# Patient Record
Sex: Female | Born: 1940
Health system: Southern US, Community
[De-identification: ages and names within clinical notes are randomized; demographics above are authoritative.]

## PROBLEM LIST (undated history)

## (undated) DIAGNOSIS — E785 Hyperlipidemia, unspecified: Secondary | ICD-10-CM

## (undated) DIAGNOSIS — E079 Disorder of thyroid, unspecified: Secondary | ICD-10-CM

## (undated) DIAGNOSIS — M810 Age-related osteoporosis without current pathological fracture: Secondary | ICD-10-CM

## (undated) DIAGNOSIS — I10 Essential (primary) hypertension: Secondary | ICD-10-CM

## (undated) DIAGNOSIS — M1990 Unspecified osteoarthritis, unspecified site: Secondary | ICD-10-CM

## (undated) DIAGNOSIS — E039 Hypothyroidism, unspecified: Secondary | ICD-10-CM

## (undated) HISTORY — DX: Hyperlipidemia, unspecified: E78.5

## (undated) HISTORY — DX: Essential (primary) hypertension: I10

## (undated) HISTORY — PX: APPENDECTOMY: SHX54

## (undated) HISTORY — PX: TUBAL LIGATION: SHX77

---
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
## 1999-02-05 ENCOUNTER — Other Ambulatory Visit: Admission: RE | Admit: 1999-02-05 | Discharge: 1999-02-05 | Payer: Self-pay

## 2000-02-16 ENCOUNTER — Other Ambulatory Visit: Admission: RE | Admit: 2000-02-16 | Discharge: 2000-02-16 | Payer: Self-pay | Admitting: Family Medicine

## 2003-03-20 ENCOUNTER — Encounter: Payer: Self-pay | Admitting: Family Medicine

## 2003-03-20 ENCOUNTER — Ambulatory Visit (HOSPITAL_COMMUNITY): Admission: RE | Admit: 2003-03-20 | Discharge: 2003-03-20 | Payer: Self-pay | Admitting: Family Medicine

## 2004-04-02 ENCOUNTER — Emergency Department (HOSPITAL_COMMUNITY): Admission: EM | Admit: 2004-04-02 | Discharge: 2004-04-02 | Payer: Self-pay | Admitting: Emergency Medicine

## 2004-10-19 ENCOUNTER — Ambulatory Visit: Payer: Self-pay | Admitting: Family Medicine

## 2005-01-20 ENCOUNTER — Ambulatory Visit: Payer: Self-pay | Admitting: Family Medicine

## 2005-03-16 ENCOUNTER — Ambulatory Visit: Payer: Self-pay | Admitting: Family Medicine

## 2005-07-19 ENCOUNTER — Ambulatory Visit: Payer: Self-pay | Admitting: Family Medicine

## 2005-11-18 ENCOUNTER — Ambulatory Visit: Payer: Self-pay | Admitting: Family Medicine

## 2006-01-13 ENCOUNTER — Ambulatory Visit: Payer: Self-pay | Admitting: Family Medicine

## 2006-05-16 ENCOUNTER — Ambulatory Visit: Payer: Self-pay | Admitting: Family Medicine

## 2006-09-20 ENCOUNTER — Ambulatory Visit: Payer: Self-pay | Admitting: Family Medicine

## 2006-09-29 ENCOUNTER — Ambulatory Visit: Payer: Self-pay | Admitting: Family Medicine

## 2007-01-27 ENCOUNTER — Ambulatory Visit: Payer: Self-pay | Admitting: Family Medicine

## 2008-12-29 ENCOUNTER — Emergency Department (HOSPITAL_COMMUNITY): Admission: EM | Admit: 2008-12-29 | Discharge: 2008-12-29 | Payer: Self-pay | Admitting: Emergency Medicine

## 2008-12-31 ENCOUNTER — Ambulatory Visit: Payer: Self-pay | Admitting: Cardiology

## 2009-01-20 ENCOUNTER — Encounter: Payer: Self-pay | Admitting: Cardiology

## 2009-01-20 ENCOUNTER — Ambulatory Visit: Payer: Self-pay | Admitting: Cardiology

## 2009-02-07 ENCOUNTER — Ambulatory Visit (HOSPITAL_COMMUNITY): Admission: RE | Admit: 2009-02-07 | Discharge: 2009-02-07 | Payer: Self-pay | Admitting: Orthopaedic Surgery

## 2010-05-06 HISTORY — PX: ROTATOR CUFF REPAIR: SHX139

## 2010-05-11 ENCOUNTER — Inpatient Hospital Stay (HOSPITAL_COMMUNITY): Admission: RE | Admit: 2010-05-11 | Discharge: 2010-05-13 | Payer: Self-pay | Admitting: Orthopedic Surgery

## 2011-02-22 LAB — CBC
HCT: 31.6 % — ABNORMAL LOW (ref 36.0–46.0)
Hemoglobin: 10.9 g/dL — ABNORMAL LOW (ref 12.0–15.0)
Hemoglobin: 11.9 g/dL — ABNORMAL LOW (ref 12.0–15.0)
MCHC: 34 g/dL (ref 30.0–36.0)
MCHC: 34.6 g/dL (ref 30.0–36.0)
MCHC: 35.3 g/dL (ref 30.0–36.0)
RBC: 3.32 MIL/uL — ABNORMAL LOW (ref 3.87–5.11)
RBC: 3.58 MIL/uL — ABNORMAL LOW (ref 3.87–5.11)
RBC: 4.56 MIL/uL (ref 3.87–5.11)
WBC: 6.6 10*3/uL (ref 4.0–10.5)
WBC: 6.7 10*3/uL (ref 4.0–10.5)

## 2011-02-22 LAB — BASIC METABOLIC PANEL
CO2: 27 mEq/L (ref 19–32)
CO2: 29 mEq/L (ref 19–32)
CO2: 30 mEq/L (ref 19–32)
Calcium: 8.4 mg/dL (ref 8.4–10.5)
Calcium: 8.5 mg/dL (ref 8.4–10.5)
Calcium: 9.5 mg/dL (ref 8.4–10.5)
Creatinine, Ser: 0.82 mg/dL (ref 0.4–1.2)
Creatinine, Ser: 0.83 mg/dL (ref 0.4–1.2)
GFR calc Af Amer: >60 mL/min (ref >60)
GFR calc Af Amer: >60 mL/min (ref >60)
GFR calc Af Amer: >60 mL/min (ref >60)
GFR calc non Af Amer: >60 mL/min (ref >60)
Glucose, Bld: 99 mg/dL (ref 70–99)
Potassium: 3.2 mEq/L — ABNORMAL LOW (ref 3.5–5.1)
Sodium: 136 mEq/L (ref 135–145)
Sodium: 139 mEq/L (ref 135–145)

## 2011-02-22 LAB — URINALYSIS, ROUTINE W REFLEX MICROSCOPIC
Bilirubin Urine: NEGATIVE
Ketones, ur: NEGATIVE mg/dL
Nitrite: NEGATIVE
Protein, ur: NEGATIVE mg/dL
Urobilinogen, UA: 1 mg/dL (ref 0.0–1.0)
pH: 5.5 (ref 5.0–8.0)

## 2011-02-22 LAB — DIFFERENTIAL
Basophils Absolute: 0 10*3/uL (ref 0.0–0.1)
Basophils Relative: 0 % (ref 0–1)
Lymphocytes Relative: 38 % (ref 12–46)
Monocytes Absolute: 0.6 10*3/uL (ref 0.1–1.0)
Neutro Abs: 3.5 10*3/uL (ref 1.7–7.7)
Neutrophils Relative %: 52 % (ref 43–77)

## 2011-02-22 LAB — TYPE AND SCREEN
ABO/RH(D): A POS
Antibody Screen: NEGATIVE

## 2011-02-22 LAB — ABO/RH: ABO/RH(D): A POS

## 2011-02-22 LAB — APTT: aPTT: 30 seconds (ref 24–37)

## 2011-02-22 LAB — PROTIME-INR: INR: 1.04 (ref 0.00–1.49)

## 2011-02-22 LAB — URINE MICROSCOPIC-ADD ON

## 2011-03-18 LAB — BASIC METABOLIC PANEL
BUN: 13 mg/dL (ref 6–23)
CO2: 26 mEq/L (ref 19–32)
Glucose, Bld: 111 mg/dL — ABNORMAL HIGH (ref 70–99)
Potassium: 3.8 mEq/L (ref 3.5–5.1)
Sodium: 139 mEq/L (ref 135–145)

## 2011-03-18 LAB — CBC
HCT: 42.6 % (ref 36.0–46.0)
Hemoglobin: 14.9 g/dL (ref 12.0–15.0)
MCHC: 34.9 g/dL (ref 30.0–36.0)
Platelets: 365 10*3/uL (ref 150–400)
RDW: 12.6 % (ref 11.5–15.5)

## 2011-03-22 LAB — COMPREHENSIVE METABOLIC PANEL
ALT: 34 U/L (ref 0–35)
AST: 37 U/L (ref 0–37)
Albumin: 4.3 g/dL (ref 3.5–5.2)
Calcium: 9.8 mg/dL (ref 8.4–10.5)
GFR calc Af Amer: >60 mL/min (ref >60)
Potassium: 4 mEq/L (ref 3.5–5.1)
Sodium: 139 mEq/L (ref 135–145)
Total Protein: 7.4 g/dL (ref 6.0–8.3)

## 2011-03-22 LAB — CBC
MCHC: 33.3 g/dL (ref 30.0–36.0)
Platelets: 379 10*3/uL (ref 150–400)
RDW: 13 % (ref 11.5–15.5)

## 2011-03-22 LAB — DIFFERENTIAL
Eosinophils Absolute: 0 10*3/uL (ref 0.0–0.7)
Eosinophils Relative: 1 % (ref 0–5)
Lymphs Abs: 2.7 10*3/uL (ref 0.7–4.0)
Monocytes Absolute: 0.8 10*3/uL (ref 0.1–1.0)
Monocytes Relative: 10 % (ref 3–12)
Neutrophils Relative %: 54 % (ref 43–77)

## 2011-03-22 LAB — POCT CARDIAC MARKERS: Troponin i, poc: <0.05 ng/mL (ref 0.00–0.09)

## 2011-04-20 NOTE — Assessment & Plan Note (Signed)
[facility]                            CARDIOLOGY OFFICE NOTE   NAME:Chelsea Finley, Chelsea Finley                      MRN:          [id number]  DATE:12/31/2008                            DOB:          09-Jul-1941    REFERRING PHYSICIAN:  Donnetta Hutching, MD   PRIMARY CARE PHYSICIAN:  Delaney Meigs, MD   REASON FOR PRESENTATION:  Evaluate the patient with chest pain.   HISTORY OF PRESENT ILLNESS:  The patient is a lovely 70 year old white  female without prior cardiac history.  She was in the emergency room at  [facility] on Sunday with chest discomfort.  There was no objective  evidence of ischemia.  Cardiac enzymes were normal.  An EKG was  unremarkable.  It was thought to be somewhat atypical and she was  referred here for followup.   The patient reports that she was at church.  She developed some pressure  in her lower chest, upper epigastric area.  It seemed to radiate  slightly upward.  It was 5/10 in intensity.  She did feel like her head  got tight, and she felt somewhat lightheaded and presyncopal though she  did not lose consciousness.  She was encouraged by family to present to  [facility].  By the time she got there, the discomfort had abated.  It  lasted for a total of 25 to 30 minutes.  It went away on its own.  There  was no associated nausea, vomiting, or diaphoresis.  There was no  associated palpitations, presyncope, or syncope.  She had no significant  shortness of breath.   The patient is somewhat limited in her activities by knee pain.  However, she does vacuuming and other chores of daily living and does  not get any cardiovascular symptoms.   PAST MEDICAL HISTORY:  1. Hypertension x4 years.  2. Arthritis.  3. Hyperlipidemia, recently diagnosed.   PAST SURGICAL HISTORY:  Appendectomy and tubal ligation.   ALLERGIES:  None.   MEDICATIONS:  Tramadol and hydrochlorothiazide 25 mg daily.   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  She is active in church.  She has 4 children and 9 grandchildren.  She does not smoke cigarettes.  She does not drink alcohol.   FAMILY HISTORY:  Noncontributory for early coronary artery disease.  Her  father died of lung problems.  Mother died of natural causes.   REVIEW OF SYSTEMS:  As stated in the HPI and positive for shoulder pains  and joint pains.  Negative for all other systems.   PHYSICAL EXAMINATION:  GENERAL:  The patient is in no distress.  VITAL SIGNS:  Blood pressure 162/93, heart rate 82 and regular, weight  206 pounds, and body mass index 36.  HEENT:  Eyelids unremarkable; pupils equal, round, and reactive to  light; fundi within normal limits, oral mucosa unremarkable.  NECK:  No jugular venous distention at 45 degrees; carotid upstroke  brisk and symmetric; no bruits, no thyromegaly.  LYMPHATICS:  No cervical, axillary, or inguinal adenopathy.  LUNGS:  Clear to auscultation bilaterally.  BACK:  No costovertebral angle tenderness.  CHEST:  Unremarkable.  HEART:  PMI not displaced or sustained; S1 and S2 within normal limits;  no S3, no S4; no clicks, no rubs, no murmurs.  ABDOMEN:  Obese; positive  bowel sounds; normal in frequency and pitch; no bruits, no rebound, no  guarding; no midline pulsatile mass; no hepatomegaly or splenomegaly.  SKIN:  No rashes.  No nodules.  EXTREMITIES:  Pulses 2+ throughout; no edema, no cyanosis, no clubbing.  NEURO:  Oriented to person, place, and time.  Cranial nerves II through  XII grossly intact, motor grossly intact throughout.   EKG:  Sinus rhythm, rate 82, axis within normal limits, intervals within  normal limits, poor anterior R-wave progression, and no acute ST-T wave  changes.   ASSESSMENT AND PLAN:  1. Chest pain.  The patient's chest pain is atypical.  There has been      no objective evidence of ischemia.  I think the pretest probability      of obstructive coronary disease is somewhat low.  I think  screening      her with an exercise treadmill test would be reasonable.  This will      allow me to rule out obstructive coronary disease, risk stratify,      and most importantly try to give her a prescription for exercise.      Despite her knee pain, she thinks she would be able to ambulate on      a treadmill and we will try this.  2. Obesity.  We discussed weight loss, diet, and exercise, and she is      committed to trying Weight Watchers.  3. Hypertension.  Her blood pressure is elevated here, which she      states her nurse, who is her daughter, takes it at home and it is      never elevated.  She will keep an eye on this.  It should come down      even better with weight loss.  4. Dyslipidemia.  She was recently found to have slightly elevated      lipids.  Dr. Lysbeth Galas started her on Vytorin, but she is out of those      samples.  She is going to go back to him to get a      prescription for this.  5. Followup.  I will see her at the time of her treadmill.     Rollene Rotunda, MD, [facility]  Electronically Signed    JH/MedQ  DD: 12/31/2008  DT: 01/01/2009  Job #: [id number]   cc:   Delaney Meigs, M.D.

## 2011-04-20 NOTE — Op Note (Signed)
Chelsea Finley, Chelsea Finley               ACCOUNT NO.:  [id number]   MEDICAL RECORD NO.:  [id number]          PATIENT TYPE:  AMB   LOCATION:  SDS                          FACILITY:  [facility]   PHYSICIAN:  Vanita Panda. Magnus Ivan, M.D.DATE OF BIRTH:  [birth year]   DATE OF PROCEDURE:  02/07/2009  DATE OF DISCHARGE:  02/07/2009                               OPERATIVE REPORT   PREOPERATIVE DIAGNOSIS:  Right shoulder severe glenohumeral joint  arthritis likely partial rotator cuff tearing and impingement.   POSTOPERATIVE DIAGNOSES:  1. Severe right shoulder glenohumeral arthritis.  2. Partial tearing of the rotator cuff.  3. Impingement.  4. Bursitis of right shoulder.   PROCEDURE:  1. Right shoulder arthroscopy with extensive debridement.  2. Right shoulder arthroscopic subacromial decompression.   SURGEON:  Vanita Panda. Magnus Ivan, MD   ANESTHESIA:  1. Regional right shoulder block.  2. General.   BLOOD LOSS:  Minimal.   COMPLICATIONS:  None.   INDICATIONS:  Briefly, Chelsea Finley is a right-hand-dominant 70 year old  with severe arthritis involving both her shoulders.  This began to  develop a debilitating type of pain for her associated with overhead  activity, and x-rays confirmed severe arthritis in that shoulder, but  the ball was not high riding.  I talked to her about the possibility of  a partial shoulder replacement versus arthroscopic surgery, and she  wanted to try to have as minimal surgery as possible.  I told her that  the arthroscopic surgery could be a temporizing fix for her and that she  should give it a try and she should do well with an injection as well.   PROCEDURE DESCRIPTION:  After informed consent was obtained, appropriate  right shoulder was marked, anesthesia obtained, regional shoulder block.  She was then brought to the operating room, placed supine on the  operating table.  General anesthesia was obtained.  She was then  fashioned in a beach chair  position with appropriate padding and  positioning of the head and neck and the nonoperative left arm.  A right  arm was prepped and draped with DuraPrep and sterile drapes and placed  in in-line skeletal traction with 10 pounds of traction and 45 degrees  of forward flexion and neutral rotation.  There was also appropriate  bending at the waist and knees, but she had pulses in her feet.  A time-  out was called, and she was identified as the correct patient and  correct right extremity and correct right shoulder.  I then made a  posterior portal off the posterolateral edge of the acromion and the  glenohumeral joint was encountered.  There was significant synovitis  throughout, the joint and there was significant cartilage irregularities  of the humeral head and the glenoid.  I then made an anterior portal  just lateral to the coracoid process.  An arthroscopic soft tissue  ablation wand and a shaver was placed.  An extensive  debridement  was  carried out at the labrum, the undersurface the rotator cuff, and on the  ball-and-socket themselves.  Next, I entered the subacromial space  through  the posterior portal and a separate lateral portal was made off  the lateral edge of the acromion approximately 2 cm.  Arthroscope was  inserted.  Again in the subacromial space, a soft tissue ablation wand  was placed laterally and there was significant bursitis and bone spurs  in the subacromial area.  After a thorough decompression using soft  tissue ablation wand and a high-speed bur to coplane underneath the  clavicle and the acromion to remove bone spurs, there was more than  adequate space to the rotator cuff.  I did clean the significant  tendinosis and tendonitis off the rotator cuff and found that the  majority of the rotator cuff was attached to the bone and it was scarred  down significantly as well.  There was probably full-thickness tearing  of the most anterior leading  edge of the  rotator cuff, but it all moved  as a unit and I felt that I should not proceed with any type of repair  due to the fact that  it felt like debridement was going to be adequate  for her.  I then removed all instrumentation and closed the portal sites  with interrupted 3-0 nylon suture.  Xeroform followed up with well-  padded sterile dressing was applied, and the patient arm was placed in a  standard arm sling.  She was awakened, extubated, and taken to recovery  room in stable condition.  All final counts were correct, and there were  no complications noted.       Vanita Panda. Magnus Ivan, M.D.  Electronically Signed     CYB/MEDQ  D:  02/07/2009  T:  02/08/2009  Job:  [id number]

## 2011-11-03 ENCOUNTER — Encounter (HOSPITAL_COMMUNITY): Payer: Self-pay

## 2011-11-05 ENCOUNTER — Other Ambulatory Visit (HOSPITAL_COMMUNITY): Payer: Self-pay | Admitting: Orthopaedic Surgery

## 2011-11-10 ENCOUNTER — Encounter (HOSPITAL_COMMUNITY)
Admission: RE | Admit: 2011-11-10 | Discharge: 2011-11-10 | Disposition: A | Discharge status: Home / Self Care | Payer: Medicare Other | Source: Ambulatory Visit | Attending: Orthopaedic Surgery | Admitting: Orthopaedic Surgery

## 2011-11-10 ENCOUNTER — Encounter (HOSPITAL_COMMUNITY): Payer: Self-pay

## 2011-11-10 ENCOUNTER — Other Ambulatory Visit: Payer: Self-pay

## 2011-11-10 ENCOUNTER — Ambulatory Visit (HOSPITAL_COMMUNITY)
Admission: RE | Admit: 2011-11-10 | Discharge: 2011-11-10 | Disposition: A | Discharge status: Home / Self Care | Payer: Medicare Other | Source: Ambulatory Visit | Attending: Orthopaedic Surgery | Admitting: Orthopaedic Surgery

## 2011-11-10 DIAGNOSIS — Z01810 Encounter for preprocedural cardiovascular examination: Secondary | ICD-10-CM | POA: Insufficient documentation

## 2011-11-10 DIAGNOSIS — Z01818 Encounter for other preprocedural examination: Secondary | ICD-10-CM | POA: Insufficient documentation

## 2011-11-10 DIAGNOSIS — Z01812 Encounter for preprocedural laboratory examination: Secondary | ICD-10-CM | POA: Insufficient documentation

## 2011-11-10 DIAGNOSIS — Z96619 Presence of unspecified artificial shoulder joint: Secondary | ICD-10-CM | POA: Insufficient documentation

## 2011-11-10 LAB — PROTIME-INR
INR: 1 (ref 0.00–1.49)
Prothrombin Time: 13.4 seconds (ref 11.6–15.2)

## 2011-11-10 LAB — APTT: aPTT: 33 seconds (ref 24–37)

## 2011-11-10 LAB — BASIC METABOLIC PANEL
CO2: 25 mEq/L (ref 19–32)
Calcium: 9.4 mg/dL (ref 8.4–10.5)
Creatinine, Ser: 0.74 mg/dL (ref 0.50–1.10)
GFR calc non Af Amer: 84 mL/min — ABNORMAL LOW (ref >90)

## 2011-11-10 LAB — URINALYSIS, ROUTINE W REFLEX MICROSCOPIC
Bilirubin Urine: NEGATIVE
Ketones, ur: 15 mg/dL — AB
Nitrite: NEGATIVE
Specific Gravity, Urine: 1.006 (ref 1.005–1.030)
Urobilinogen, UA: 0.2 mg/dL (ref 0.0–1.0)

## 2011-11-10 LAB — CBC
MCH: 31.4 pg (ref 26.0–34.0)
MCV: 92.7 fL (ref 78.0–100.0)
Platelets: 353 10*3/uL (ref 150–400)
RDW: 12.9 % (ref 11.5–15.5)
WBC: 6.9 10*3/uL (ref 4.0–10.5)

## 2011-11-10 LAB — URINE MICROSCOPIC-ADD ON

## 2011-11-10 NOTE — Pre-Procedure Instructions (Signed)
20 Chelsea Finley  11/10/2011   Your procedure is scheduled on:  Dec11  Report to [facility] at 0930 AM.  Call this number if you have problems the morning of surgery: [phone]   Remember:   Do not eat food:After Midnight.  May have clear liquids: up to 4 Hours before arrival.  Clear liquids include soda, tea, black coffee, apple or grape juice, broth.  Take these medicines the morning of surgery with A SIP OF WATER: tramadol   Do not wear jewelry, make-up or nail polish.  Do not wear lotions, powders, or perfumes. You may wear deodorant.  Do not shave 48 hours prior to surgery.  Do not bring valuables to the hospital.  Contacts, dentures or bridgework may not be worn into surgery.  Leave suitcase in the car. After surgery it may be brought to your room.  For patients admitted to the hospital, checkout time is 11:00 AM the day of discharge.   Patients discharged the day of surgery will not be allowed to drive home.  Name and phone number of your driver: NA  Special Instructions: CHG Shower Use Special Wash: 1/2 bottle night before surgery and 1/2 bottle morning of surgery.   Please read over the following fact sheets that you were given: Pain Booklet, Blood Transfusion Information, Total Joint Packet and Surgical Site Infection Prevention

## 2011-11-15 MED ORDER — CEFAZOLIN SODIUM-DEXTROSE 2-3 GM-% IV SOLR
2.0000 g | INTRAVENOUS | Status: AC
  Administered 2011-11-16: 2 g via INTRAVENOUS
  Filled 2011-11-15: qty 50

## 2011-11-16 ENCOUNTER — Inpatient Hospital Stay (HOSPITAL_COMMUNITY): Payer: Medicare Other

## 2011-11-16 ENCOUNTER — Encounter (HOSPITAL_COMMUNITY): Payer: Self-pay | Admitting: Anesthesiology

## 2011-11-16 ENCOUNTER — Inpatient Hospital Stay (HOSPITAL_COMMUNITY): Payer: Medicare Other | Admitting: Anesthesiology

## 2011-11-16 ENCOUNTER — Encounter (HOSPITAL_COMMUNITY)
Admission: RE | Disposition: A | Discharge status: Home Health | Payer: Self-pay | Source: Ambulatory Visit | Attending: Orthopaedic Surgery

## 2011-11-16 ENCOUNTER — Inpatient Hospital Stay (HOSPITAL_COMMUNITY)
Admission: RE | Admit: 2011-11-16 | Discharge: 2011-11-18 | DRG: 470 | Disposition: A | Discharge status: Home Health | Payer: Medicare Other | Source: Ambulatory Visit | Attending: Orthopaedic Surgery | Admitting: Orthopaedic Surgery

## 2011-11-16 ENCOUNTER — Encounter (HOSPITAL_COMMUNITY): Payer: Self-pay | Admitting: Surgery

## 2011-11-16 DIAGNOSIS — M1711 Unilateral primary osteoarthritis, right knee: Secondary | ICD-10-CM

## 2011-11-16 DIAGNOSIS — Z886 Allergy status to analgesic agent status: Secondary | ICD-10-CM

## 2011-11-16 DIAGNOSIS — Z79899 Other long term (current) drug therapy: Secondary | ICD-10-CM

## 2011-11-16 DIAGNOSIS — M1710 Unilateral primary osteoarthritis, unspecified knee: Principal | ICD-10-CM | POA: Diagnosis present

## 2011-11-16 HISTORY — PX: KNEE ARTHROPLASTY: SHX992

## 2011-11-16 IMAGING — CR DG KNEE 1-2V PORT*R*
2 series · 2 of 2 positions shown · non-contrast
Comparison: None.

CLINICAL DATA: Right knee arthroplasty.  Postop.

PORTABLE RIGHT KNEE - 1-2 VIEW

[view not recorded (1 of 2)]
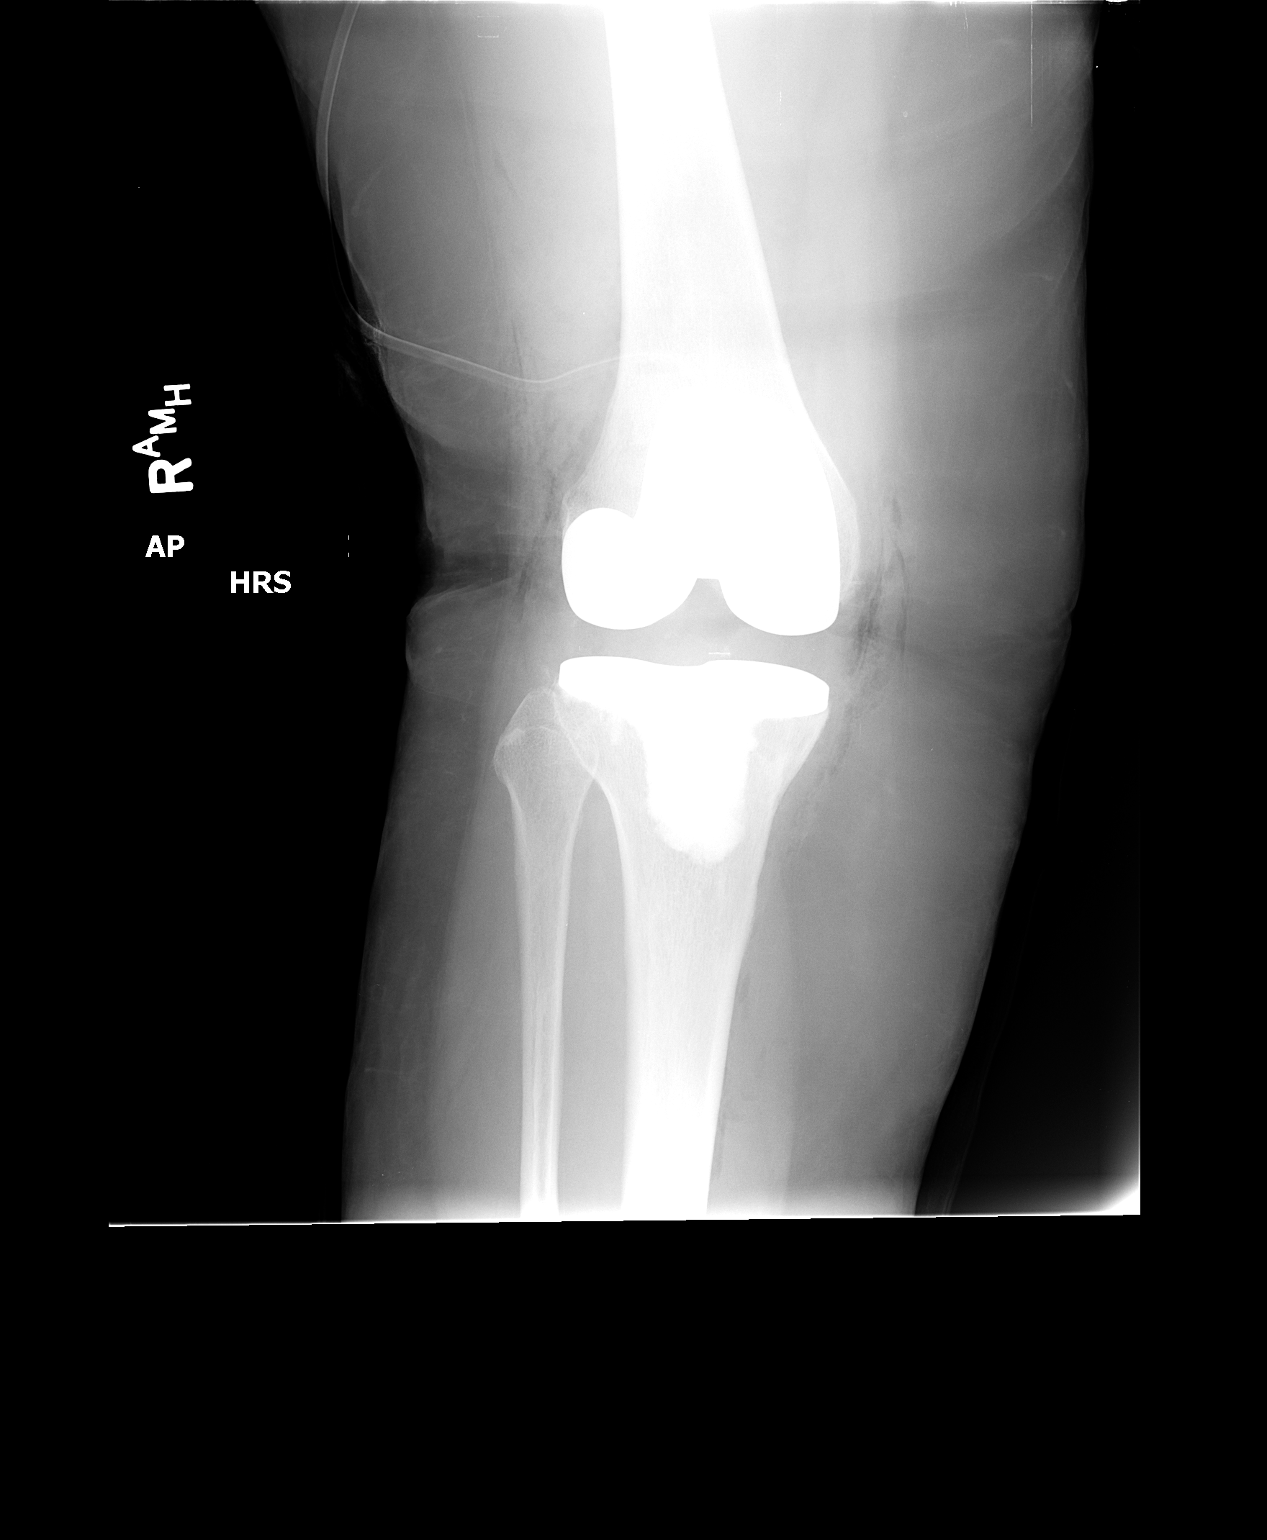

[view not recorded (2 of 2)]
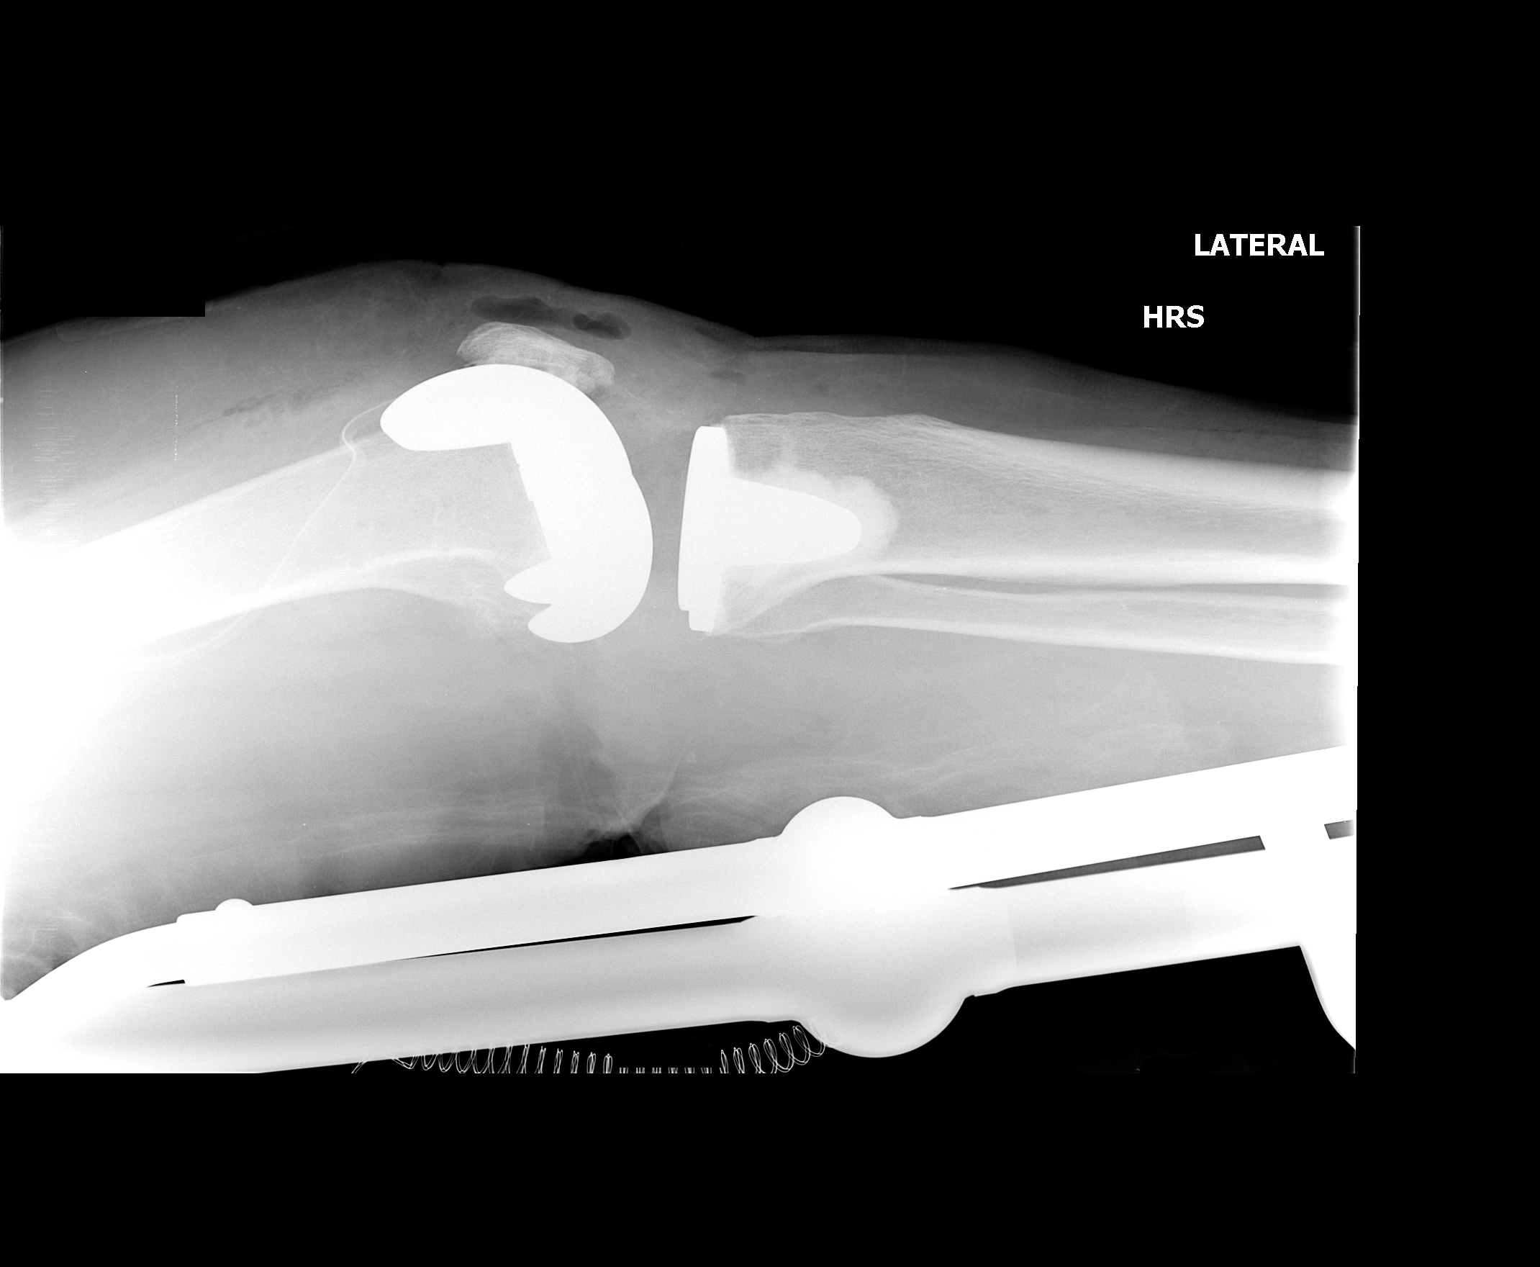

[2 of 2 positions shown; findings below may reference images not displayed]

FINDINGS: Status post right knee arthroplasty.  No acute hardware
complication identified.  Expected postoperative swelling and gas
about the knee.  Surgical drain in place.
IMPRESSION: Expected appearance after right knee arthroplasty.

## 2011-11-16 SURGERY — ARTHROPLASTY, KNEE, TOTAL, USING IMAGELESS COMPUTER-ASSISTED NAVIGATION
Anesthesia: General | Site: Knee | Laterality: Right | Wound class: Clean

## 2011-11-16 MED ORDER — HYDROMORPHONE HCL PF 1 MG/ML IJ SOLN
0.2500 mg | INTRAMUSCULAR | Status: DC | PRN
Start: 2011-11-16 — End: 2011-11-16
  Administered 2011-11-16 (×4): 0.5 mg via INTRAVENOUS

## 2011-11-16 MED ORDER — ALUM & MAG HYDROXIDE-SIMETH 200-200-20 MG/5ML PO SUSP: 30.0000 mL | ORAL | Status: DC | PRN

## 2011-11-16 MED ORDER — LACTATED RINGERS IV SOLN
INTRAVENOUS | Status: DC | PRN
  Administered 2011-11-16 (×2): via INTRAVENOUS

## 2011-11-16 MED ORDER — METHOCARBAMOL 500 MG PO TABS
500.0000 mg | ORAL_TABLET | Freq: Four times a day (QID) | ORAL | Status: DC | PRN
  Administered 2011-11-17 – 2011-11-18 (×4): 500 mg via ORAL
  Filled 2011-11-16 (×5): qty 1

## 2011-11-16 MED ORDER — PROMETHAZINE HCL 25 MG/ML IJ SOLN: 6.2500 mg | INTRAMUSCULAR | Status: DC | PRN

## 2011-11-16 MED ORDER — OXYCODONE-ACETAMINOPHEN 5-325 MG PO TABS: 1.0000 | ORAL_TABLET | ORAL | Status: DC | PRN

## 2011-11-16 MED ORDER — CEFAZOLIN SODIUM 1-5 GM-% IV SOLN
1.0000 g | Freq: Four times a day (QID) | INTRAVENOUS | Status: AC
  Administered 2011-11-16 – 2011-11-17 (×3): 1 g via INTRAVENOUS
  Filled 2011-11-16 (×3): qty 50

## 2011-11-16 MED ORDER — ONDANSETRON HCL 4 MG/2ML IJ SOLN: 4.0000 mg | Freq: Four times a day (QID) | INTRAMUSCULAR | Status: DC | PRN

## 2011-11-16 MED ORDER — KETOROLAC TROMETHAMINE 30 MG/ML IJ SOLN
30.0000 mg | Freq: Once | INTRAMUSCULAR | Status: AC
  Administered 2011-11-16: 30 mg via INTRAVENOUS

## 2011-11-16 MED ORDER — PHENOL 1.4 % MT LIQD
1.0000 | OROMUCOSAL | Status: DC | PRN
  Filled 2011-11-16: qty 177

## 2011-11-16 MED ORDER — BUPIVACAINE-EPINEPHRINE PF 0.5-1:200000 % IJ SOLN
INTRAMUSCULAR | Status: DC | PRN
  Administered 2011-11-16: 30 mL

## 2011-11-16 MED ORDER — FERROUS SULFATE 325 (65 FE) MG PO TABS
325.0000 mg | ORAL_TABLET | Freq: Three times a day (TID) | ORAL | Status: DC
  Administered 2011-11-17 – 2011-11-18 (×5): 325 mg via ORAL
  Filled 2011-11-16 (×7): qty 1

## 2011-11-16 MED ORDER — DEXTROSE 5 % IV SOLN: 500.0000 mg | Freq: Four times a day (QID) | INTRAVENOUS | Status: DC | PRN

## 2011-11-16 MED ORDER — METOCLOPRAMIDE HCL 5 MG/ML IJ SOLN
5.0000 mg | Freq: Three times a day (TID) | INTRAMUSCULAR | Status: DC | PRN
  Filled 2011-11-16: qty 2

## 2011-11-16 MED ORDER — ONDANSETRON HCL 4 MG PO TABS: 4.0000 mg | ORAL_TABLET | Freq: Four times a day (QID) | ORAL | Status: DC | PRN

## 2011-11-16 MED ORDER — HYDROMORPHONE HCL PF 1 MG/ML IJ SOLN
INTRAMUSCULAR | Status: AC
  Filled 2011-11-16: qty 1

## 2011-11-16 MED ORDER — HYDROMORPHONE HCL PF 1 MG/ML IJ SOLN: 0.5000 mg | INTRAMUSCULAR | Status: DC | PRN

## 2011-11-16 MED ORDER — FENTANYL CITRATE 0.05 MG/ML IJ SOLN
INTRAMUSCULAR | Status: AC
  Filled 2011-11-16: qty 2

## 2011-11-16 MED ORDER — MIDAZOLAM HCL 5 MG/5ML IJ SOLN
INTRAMUSCULAR | Status: DC | PRN
  Administered 2011-11-16 (×2): 1 mg via INTRAVENOUS

## 2011-11-16 MED ORDER — KETOROLAC TROMETHAMINE 30 MG/ML IJ SOLN
INTRAMUSCULAR | Status: AC
  Filled 2011-11-16: qty 1

## 2011-11-16 MED ORDER — DIPHENHYDRAMINE HCL 12.5 MG/5ML PO ELIX
12.5000 mg | ORAL_SOLUTION | ORAL | Status: DC | PRN
  Filled 2011-11-16: qty 10

## 2011-11-16 MED ORDER — MENTHOL 3 MG MT LOZG: 1.0000 | LOZENGE | OROMUCOSAL | Status: DC | PRN

## 2011-11-16 MED ORDER — METOCLOPRAMIDE HCL 10 MG PO TABS: 5.0000 mg | ORAL_TABLET | Freq: Three times a day (TID) | ORAL | Status: DC | PRN

## 2011-11-16 MED ORDER — ACETAMINOPHEN 650 MG RE SUPP: 650.0000 mg | Freq: Four times a day (QID) | RECTAL | Status: DC | PRN

## 2011-11-16 MED ORDER — SODIUM CHLORIDE 0.9 % IR SOLN
Status: DC | PRN
  Administered 2011-11-16: 3000 mL

## 2011-11-16 MED ORDER — MIDAZOLAM HCL 2 MG/2ML IJ SOLN
INTRAMUSCULAR | Status: AC
  Filled 2011-11-16: qty 2

## 2011-11-16 MED ORDER — PROPOFOL 10 MG/ML IV EMUL
INTRAVENOUS | Status: DC | PRN
  Administered 2011-11-16: 150 mg via INTRAVENOUS

## 2011-11-16 MED ORDER — SODIUM CHLORIDE 0.9 % IV SOLN
INTRAVENOUS | Status: DC
  Administered 2011-11-16: 1000 mL via INTRAVENOUS

## 2011-11-16 MED ORDER — KETOROLAC TROMETHAMINE 30 MG/ML IJ SOLN
INTRAMUSCULAR | Status: DC | PRN
  Administered 2011-11-16: 30 mg via INTRAVENOUS

## 2011-11-16 MED ORDER — FENTANYL CITRATE 0.05 MG/ML IJ SOLN
INTRAMUSCULAR | Status: DC | PRN
  Administered 2011-11-16 (×3): 50 ug via INTRAVENOUS
  Administered 2011-11-16 (×2): 100 ug via INTRAVENOUS

## 2011-11-16 MED ORDER — ONDANSETRON HCL 4 MG/2ML IJ SOLN
INTRAMUSCULAR | Status: DC | PRN
  Administered 2011-11-16: 4 mg via INTRAVENOUS

## 2011-11-16 MED ORDER — ZOLPIDEM TARTRATE 5 MG PO TABS: 5.0000 mg | ORAL_TABLET | Freq: Every evening | ORAL | Status: DC | PRN

## 2011-11-16 MED ORDER — RIVAROXABAN 10 MG PO TABS
10.0000 mg | ORAL_TABLET | Freq: Every day | ORAL | Status: DC
  Administered 2011-11-17 – 2011-11-18 (×2): 10 mg via ORAL
  Filled 2011-11-16 (×4): qty 1

## 2011-11-16 MED ORDER — ACETAMINOPHEN 325 MG PO TABS: 650.0000 mg | ORAL_TABLET | Freq: Four times a day (QID) | ORAL | Status: DC | PRN

## 2011-11-16 MED ORDER — HYDROCODONE-ACETAMINOPHEN 5-325 MG PO TABS
1.0000 | ORAL_TABLET | ORAL | Status: DC | PRN
  Administered 2011-11-17 (×2): 1 via ORAL
  Administered 2011-11-17 (×3): 2 via ORAL
  Administered 2011-11-17 (×2): 1 via ORAL
  Administered 2011-11-18 (×3): 2 via ORAL
  Filled 2011-11-16: qty 2
  Filled 2011-11-16 (×2): qty 1
  Filled 2011-11-16 (×2): qty 2
  Filled 2011-11-16: qty 1
  Filled 2011-11-16 (×4): qty 2

## 2011-11-16 SURGICAL SUPPLY — 63 items
BANDAGE ELASTIC 6 VELCRO ST LF (GAUZE/BANDAGES/DRESSINGS) ×4 IMPLANT
BANDAGE ESMARK 6X9 LF (GAUZE/BANDAGES/DRESSINGS) ×1 IMPLANT
BLADE SAGITTAL 25.0X1.19X90 (BLADE) ×4 IMPLANT
BLADE SAW SGTL 13.0X1.19X90.0M (BLADE) ×2 IMPLANT
BNDG CMPR 9X6 STRL LF SNTH (GAUZE/BANDAGES/DRESSINGS) ×1
BNDG ESMARK 6X9 LF (GAUZE/BANDAGES/DRESSINGS) ×2
BOWL SMART MIX CTS (DISPOSABLE) ×1 IMPLANT
CEMENT HV SMART SET (Cement) ×4 IMPLANT
CLOTH BEACON ORANGE TIMEOUT ST (SAFETY) ×2 IMPLANT
COVER BACK TABLE 24X17X13 BIG (DRAPES) IMPLANT
COVER SURGICAL LIGHT HANDLE (MISCELLANEOUS) ×2 IMPLANT
CUFF TOURNIQUET SINGLE 34IN LL (TOURNIQUET CUFF) ×1 IMPLANT
CUFF TOURNIQUET SINGLE 44IN (TOURNIQUET CUFF) IMPLANT
DRAPE INCISE IOBAN 66X45 STRL (DRAPES) ×2 IMPLANT
DRAPE ORTHO SPLIT 77X108 STRL (DRAPES) ×4
DRAPE PROXIMA HALF (DRAPES) ×2 IMPLANT
DRAPE SURG ORHT 6 SPLT 77X108 (DRAPES) ×2 IMPLANT
DRAPE U-SHAPE 47X51 STRL (DRAPES) ×2 IMPLANT
DRSG PAD ABDOMINAL 8X10 ST (GAUZE/BANDAGES/DRESSINGS) ×2 IMPLANT
DURAPREP 26ML APPLICATOR (WOUND CARE) ×2 IMPLANT
ELECT REM PT RETURN 9FT ADLT (ELECTROSURGICAL) ×2
ELECTRODE REM PT RTRN 9FT ADLT (ELECTROSURGICAL) ×1 IMPLANT
EVACUATOR 1/8 PVC DRAIN (DRAIN) ×2 IMPLANT
FACESHIELD LNG OPTICON STERILE (SAFETY) ×6 IMPLANT
GAUZE XEROFORM 5X9 LF (GAUZE/BANDAGES/DRESSINGS) ×2 IMPLANT
GLOVE BIO SURGEON STRL SZ7.5 (GLOVE) ×2 IMPLANT
GLOVE BIOGEL PI IND STRL 8 (GLOVE) ×1 IMPLANT
GLOVE BIOGEL PI INDICATOR 8 (GLOVE) ×1
GLOVE ECLIPSE 7.0 STRL STRAW (GLOVE) ×2 IMPLANT
GLOVE ORTHO TXT STRL SZ7.5 (GLOVE) ×2 IMPLANT
GOWN PREVENTION PLUS LG XLONG (DISPOSABLE) IMPLANT
GOWN PREVENTION PLUS XLARGE (GOWN DISPOSABLE) ×3 IMPLANT
GOWN STRL NON-REIN LRG LVL3 (GOWN DISPOSABLE) ×4 IMPLANT
HANDPIECE INTERPULSE COAX TIP (DISPOSABLE) ×2
IMMOBILIZER KNEE 22 UNIV (SOFTGOODS) IMPLANT
KIT BASIN OR (CUSTOM PROCEDURE TRAY) ×2 IMPLANT
KIT ROOM TURNOVER OR (KITS) ×2 IMPLANT
MANIFOLD NEPTUNE II (INSTRUMENTS) ×2 IMPLANT
MARKER SPHERE PSV REFLC THRD 5 (MARKER) ×6 IMPLANT
NDL SPNL 18GX3.5 QUINCKE PK (NEEDLE) ×1 IMPLANT
NEEDLE SPNL 18GX3.5 QUINCKE PK (NEEDLE) ×2 IMPLANT
NS IRRIG 1000ML POUR BTL (IV SOLUTION) ×2 IMPLANT
PACK TOTAL JOINT (CUSTOM PROCEDURE TRAY) ×2 IMPLANT
PAD ARMBOARD 7.5X6 YLW CONV (MISCELLANEOUS) ×4 IMPLANT
PADDING CAST COTTON 6X4 STRL (CAST SUPPLIES) ×2 IMPLANT
PIN SCHANZ 4MM 130MM (PIN) IMPLANT
SET HNDPC FAN SPRY TIP SCT (DISPOSABLE) IMPLANT
SPONGE GAUZE 4X4 12PLY (GAUZE/BANDAGES/DRESSINGS) ×2 IMPLANT
STAPLER VISISTAT 35W (STAPLE) ×2 IMPLANT
STRIP CLOSURE SKIN 1/2X4 (GAUZE/BANDAGES/DRESSINGS) ×4 IMPLANT
SUCTION FRAZIER TIP 10 FR DISP (SUCTIONS) ×1 IMPLANT
SUT ETHILON 3 0 PS 1 (SUTURE) ×2 IMPLANT
SUT VIC AB 1 CT1 27 (SUTURE) ×4
SUT VIC AB 1 CT1 27XBRD ANBCTR (SUTURE) ×2 IMPLANT
SUT VIC AB 2-0 CT1 27 (SUTURE) ×4
SUT VIC AB 2-0 CT1 TAPERPNT 27 (SUTURE) ×2 IMPLANT
SUT VIC AB 4-0 PS2 27 (SUTURE) ×2 IMPLANT
SWABSTICK BENZOIN STERILE (MISCELLANEOUS) ×2 IMPLANT
SYR 50ML LL SCALE MARK (SYRINGE) ×2 IMPLANT
TOWEL OR 17X24 6PK STRL BLUE (TOWEL DISPOSABLE) ×2 IMPLANT
TOWEL OR 17X26 10 PK STRL BLUE (TOWEL DISPOSABLE) ×2 IMPLANT
TRAY FOLEY CATH 14FR (SET/KITS/TRAYS/PACK) ×1 IMPLANT
WATER STERILE IRR 1000ML POUR (IV SOLUTION) ×2 IMPLANT

## 2011-11-16 NOTE — Anesthesia Procedure Notes (Addendum)
Anesthesia Regional Block:  Femoral nerve block  Pre-Anesthetic Checklist: ,, timeout performed, Correct Patient, Correct Site, Correct Laterality, Correct Procedure, Correct Position, site marked, Risks and benefits discussed,  Surgical consent,  Pre-op evaluation,  At surgeon's request and post-op pain management  Laterality: Right  Prep: chloraprep       Needles:  Injection technique: Single-shot  Needle Type: Stimulator Needle - 40      Needle Gauge: 22 and 22 G    Additional Needles:  Procedures: nerve stimulator Femoral nerve block  Nerve Stimulator or Paresthesia:  Response: patella twitch, 0.45 mA,   Additional Responses:   Narrative:  Start time: 11/16/2011 11:29 AM End time: 11/16/2011 11:33 AM  Performed by: With CRNAs  Anesthesiologist: Sandford Craze, MD  Additional Notes: Pt identified in Holding room.  Monitors applied. Working IV access confirmed. Sterile prep.  #22ga PNS to patella twitch at 0.46mA threshold.  30cc 0.5% Bupivacaine with 1:200k epi injected incrementally after negative test dose.  Patient asymptomatic, VSS, no heme aspirated, tolerated well.      Procedure Name: LMA Insertion Date/Time: 11/16/2011 11:50 AM Performed by: Fuller Canada Pre-anesthesia Checklist: Timeout performed, Patient identified, Emergency Drugs available, Suction available and Patient being monitored Patient Re-evaluated:Patient Re-evaluated prior to inductionOxygen Delivery Method: Circle System Utilized Preoxygenation: Pre-oxygenation with 100% oxygen Intubation Type: IV induction LMA: LMA inserted LMA Size: 4.0 Number of attempts: 1 Placement Confirmation: positive ETCO2 and breath sounds checked- equal and bilateral Tube secured with: Tape Dental Injury: Teeth and Oropharynx as per pre-operative assessment

## 2011-11-16 NOTE — Preoperative (Signed)
Beta Blockers   Reason not to administer Beta Blockers:Not Applicable 

## 2011-11-16 NOTE — Anesthesia Postprocedure Evaluation (Signed)
  Anesthesia Post-op Note  Patient: Chelsea Finley  Procedure(s) Performed:  COMPUTER ASSISTED TOTAL KNEE ARTHROPLASTY - Right total knee arthroplasty  Patient Location: PACU  Anesthesia Type: GA combined with regional for post-op pain  Level of Consciousness: awake, alert  and oriented  Airway and Oxygen Therapy: Patient Spontanous Breathing and Patient connected to nasal cannula oxygen  Post-op Pain: none  Post-op Assessment: Post-op Vital signs reviewed, Patient's Cardiovascular Status Stable, Respiratory Function Stable, Patent Airway, No signs of Nausea or vomiting and Pain level controlled  Post-op Vital Signs: Reviewed and stable  Complications: No apparent anesthesia complications

## 2011-11-16 NOTE — Transfer of Care (Signed)
Immediate Anesthesia Transfer of Care Note  Patient: Chelsea Finley  Procedure(s) Performed:  COMPUTER ASSISTED TOTAL KNEE ARTHROPLASTY - Right total knee arthroplasty  Patient Location: PACU  Anesthesia Type: General  Level of Consciousness: awake, alert  and oriented  Airway & Oxygen Therapy: Patient Spontanous Breathing and Patient connected to nasal cannula oxygen  Post-op Assessment: Report given to PACU RN and Post -op Vital signs reviewed and stable  Post vital signs: Reviewed and stable  Complications: No apparent anesthesia complications

## 2011-11-16 NOTE — Brief Op Note (Signed)
11/16/2011  2:04 PM  PATIENT:  Chelsea Finley  70 y.o. female  PRE-OPERATIVE DIAGNOSIS:  Severe osteoarthritis right knee  POST-OPERATIVE DIAGNOSIS:  severe osteoarthritis right knee  PROCEDURE:  Procedure(s): COMPUTER ASSISTED TOTAL KNEE ARTHROPLASTY  SURGEON:  Surgeon(s): Kathryne Hitch  PHYSICIAN ASSISTANT:   ASSISTANTS: Maud Deed, PA-C   ANESTHESIA:   regional and general  EBL:  Total I/O In: 1000 [I.V.:1000] Out: 150 [Urine:150]  BLOOD ADMINISTERED:none  DRAINS: (Medium) Hemovact drain(s) in the knee with  Suction Open   LOCAL MEDICATIONS USED:  NONE  SPECIMEN:  No Specimen  DISPOSITION OF SPECIMEN:  N/A  COUNTS:  YES  TOURNIQUET:   Total Tourniquet Time Documented: Thigh (Right) - 87 minutes  DICTATION: .Other Dictation: Dictation Number [id number]  PLAN OF CARE: Admit to inpatient   PATIENT DISPOSITION:  PACU - hemodynamically stable.   Delay start of Pharmacological VTE agent (>24hrs) due to surgical blood loss or risk of bleeding:  {YES/NO/NOT APPLICABLE:20182

## 2011-11-16 NOTE — OR Nursing (Signed)
cpm applied, xray obtained

## 2011-11-16 NOTE — Anesthesia Preprocedure Evaluation (Signed)
Anesthesia Evaluation  Patient identified by MRN, date of birth, ID band Patient awake    Reviewed: Allergy & Precautions, H&P , NPO status , Patient's Chart, lab work & pertinent test results  Airway Mallampati: II TM Distance: >3 FB Neck ROM: Full    Dental  (+) Teeth Intact, Partial Upper, Partial Lower and Dental Advisory Given   Pulmonary neg pulmonary ROS,  clear to auscultation  Pulmonary exam normal       Cardiovascular hypertension (borderline, on no meds presently), Regular Normal    Neuro/Psych Negative Neurological ROS     GI/Hepatic Neg liver ROS, GERD-  Controlled,  Endo/Other  Morbid obesity  Renal/GU negative Renal ROS     Musculoskeletal  (+) Arthritis -, Osteoarthritis,    Abdominal (+) obese,   Peds  Hematology   Anesthesia Other Findings   Reproductive/Obstetrics                           Anesthesia Physical Anesthesia Plan  ASA: II  Anesthesia Plan: General   Post-op Pain Management:    Induction: Intravenous  Airway Management Planned: LMA  Additional Equipment:   Intra-op Plan:   Post-operative Plan:   Informed Consent: I have reviewed the patients History and Physical, chart, labs and discussed the procedure including the risks, benefits and alternatives for the proposed anesthesia with the patient or authorized representative who has indicated his/her understanding and acceptance.   Dental advisory given  Plan Discussed with: CRNA and Surgeon  Anesthesia Plan Comments: (Plan routine monitors, GA- LMA OK, femoral nerve block for post op analgesia)        Anesthesia Quick Evaluation

## 2011-11-16 NOTE — H&P (Signed)
Chelsea Finley is an 70 y.o. female.   Chief Complaint: Severe right knee pain, know end-stage arthritis HPI: 70 yo female with bone-on-bone wear of her right knee.  Wishes to proceed with a right total knee replacement.  Has failed multiple injections, rest, nsaids and ambulating with a cane.  Understands fully the risks of acute blood loss, nerve injury, DVT, PE.  Goals are increased quality of life and mobility and decreased pain.  History reviewed. No pertinent past medical history.  Past Surgical History  Procedure Date  . Rotator cuff repair 05/2010    History reviewed. No pertinent family history. Social History:  reports that she has never smoked. She does not have any smokeless tobacco history on file. She reports that she does not drink alcohol or use illicit drugs.  Allergies:  Allergies  Allergen Reactions  . Morphine And Related Nausea And Vomiting    Medications Prior to Admission  Medication Dose Route Frequency Provider Last Rate Last Dose  . ceFAZolin (ANCEF) IVPB 2 g/50 mL premix  2 g Intravenous 60 min Pre-Op Kathryne Hitch       No current outpatient prescriptions on file as of 11/16/2011.    No results found for this or any previous visit (from the past 48 hour(s)). No results found.  Review of Systems  Constitutional: Negative.   HENT: Negative.   Eyes: Negative.   Respiratory: Negative.   Cardiovascular: Negative.   Gastrointestinal: Negative.   Genitourinary: Negative.   Musculoskeletal: Positive for joint pain.  Skin: Negative.   Neurological: Negative.   Endo/Heme/Allergies: Negative.     Blood pressure 165/86, pulse 74, temperature 98.1 F (36.7 C), temperature source Oral, resp. rate 20, SpO2 94.00%. Physical Exam  Constitutional: She is oriented to person, place, and time. She appears well-developed and well-nourished.  HENT:  Head: Normocephalic and atraumatic.  Eyes: EOM are normal. Pupils are equal, round, and reactive to  light.  Neck: Normal range of motion. Neck supple.  Cardiovascular: Normal rate and regular rhythm.   Respiratory: Effort normal and breath sounds normal.  GI: Soft. Bowel sounds are normal.  Musculoskeletal:       Right knee: She exhibits decreased range of motion, effusion and abnormal alignment. tenderness found. Medial joint line and lateral joint line tenderness noted.  Neurological: She is alert and oriented to person, place, and time.  Skin: Skin is warm and dry.  Psychiatric: She has a normal mood and affect.     Assessment/Plan To the OR today for a right total knee replacement.  [name] Y 11/16/2011, 11:23 AM

## 2011-11-17 LAB — BASIC METABOLIC PANEL
BUN: 11 mg/dL (ref 6–23)
Chloride: 105 mEq/L (ref 96–112)
GFR calc Af Amer: >90 mL/min (ref >90)
Potassium: 3.2 mEq/L — ABNORMAL LOW (ref 3.5–5.1)

## 2011-11-17 LAB — CBC
HCT: 31.7 % — ABNORMAL LOW (ref 36.0–46.0)
Platelets: 268 10*3/uL (ref 150–400)
RDW: 13.1 % (ref 11.5–15.5)
WBC: 7.4 10*3/uL (ref 4.0–10.5)

## 2011-11-17 NOTE — Op Note (Signed)
NAMEAVANNA, SOWDER               ACCOUNT NO.:  [id number]  MEDICAL RECORD NO.:  [id number]  LOCATION:  5019                         FACILITY:  [facility]  PHYSICIAN:  Vanita Panda. Magnus Ivan, M.D.DATE OF BIRTH:  1941/03/25  DATE OF PROCEDURE:  11/16/2011 DATE OF DISCHARGE:                              OPERATIVE REPORT   PREOPERATIVE DIAGNOSIS:  Severe osteoarthritis and degenerative disease, right knee.  POSTOPERATIVE DIAGNOSIS:  Severe osteoarthritis and degenerative disease, right knee.  PROCEDURE:  Right total knee arthroplasty utilizing computer navigation as assistance.  IMPLANTS:  DePuy Sigma rotating platform knee with size 2.5 femur, size 2.5 tibial tray, 15-mm polyethylene insert, 32-mm patellar button.  SURGEON:  Vanita Panda. Magnus Ivan, MD  ASSISTANT:  Wende Neighbors, PA-C  ANESTHESIA: 1. Right leg femoral nerve block. 2. General.  TOURNIQUET TIME:  83 minutes.  BLOOD LOSS:  Less than 200 mL.  COMPLICATIONS:  None.  INDICATIONS:  Ms. Hellickson is a 70 year old patient well known to me.  I have seen her for many years for severe bilateral knee arthritis.  We injected this knee multiple times including hyaluronic cast injections and steroid injections.  She has been on anti-inflammatories and tried assisted devices and nothing helped.  It has greatly impacted her activities of daily living.  Her pain has been severe now, and she wished to proceed with a total knee arthroplasty.  The risks and benefits of this have been explained to her including the risk of blood loss, nerve and vessel injury, DVT and PE, with goals of increased mobility, increased function, and decreased pain, and increased quality of life.  PROCEDURE DESCRIPTION:  After informed consent was obtained, appropriate right leg was marked, anesthesia obtained with femoral nerve block.  She was then brought to the operating room and placed supine on the operating table.  General anesthesia was  then obtained.  A nonsterile tourniquet was placed around her upper right thigh, and her right leg was prepped and draped from the thigh down to the ankle with DuraPrep and sterile drapes including sterile stockinette.  A time-out was called and she has been identified as the correct patient, correct right knee. I then used an Esmarch to wrap out the leg and tourniquet was inflated to 300 mm of pressure.  A midline incision was made over the patella and carried proximally and distally.  I dissected down to the knee joint and then a medial parapatellar arthrotomy was undertaken.  There was a large effusion encountered from the knee and then once we were able to open up the knee we found significant loss of cartilage throughout the knee itself, there was severe tricompartmental arthritis and significant synovitis.  I cleaned the knee synovium and osteophytes from the knee. I removed remnants of the medial and lateral meniscus as well as the PCL and ACL.  I then began with the computer navigation portion of the case. Two Steinmann pins were placed through 2 small stab incisions in the distal tibia.  These were temporary pins, 2 pins were placed at the main incision in the metaphyseal section of the femur.  Small globes were placed over this, so a computer-generated model could be made after selecting  points throughout the hip, knee and ankle.  Based on our computer model, we then made our tibial cut with 10 mm off the high side.  We used our soft tissue tensioner to balance her knee in flexion and extension.  We chose a size 2 femur and made our distal femoral cut. We then set our rotation guide and made our chamfer cuts as well as our anterior and posterior cuts.  Attention was turned back to the tibia again, and we drilled for a post and keel for a size 2.5 tibia.  We then placed the 2.5 tibial tray, which was a trial followed by 2.5 femur, and we trialed insertion with 12.5 up to 15 and  the 15 gave her the most stability with improving her 10 degrees of varus to down less than 3 degrees.  We then drilled lugs of the femur and made a cut for the patella and placed a 32-mm trial patellar button.  I was pleased with the  alignment, range of motion, and stability also and removed all trial components.  We copiously irrigated the knee with normal saline solution, then cemented the real 2.5 mm tibial tray from DePuy followed by the real 2.5 femur.  We placed the real 15-mm polyethylene insert and cemented the real 32-mm patella button.  We then irrigated the knee again.  The tourniquet was let down once the cement had dried, and hemostasis was obtained with electrocautery.  A medium Hemovac drain was placed in the arthrotomy.  The arthrotomy was closed with interrupted #1 Vicryl suture, followed by 2-0 Vicryl subcutaneous tissue, 4-0 Vicryl subcuticular stitch and Steri-Strips.  The Steinmann pins were all removed as well and 3-0 nylon was used to close the 2 stab incisions in the distal tibia.  Once the tourniquet was let down too, the toes pinkened nicely.  A well-padded sterile dressing was applied.  The patient was awakened, extubated, and taken to the recovery room in stable condition.  All final counts were correct.  There were no complications noted.  Of note, Maud Deed, PA-C was present during the entire case and her assistance was integral given the computer navigation portion of the case done as well as inserting the implants.     Vanita Panda. Magnus Ivan, M.D.     CYB/MEDQ  D:  11/16/2011  T:  11/17/2011  Job:  [id number]

## 2011-11-17 NOTE — Progress Notes (Signed)
Physical Therapy Evaluation Patient Details Name: Chelsea Finley MRN: [id number] DOB: [birth year] Today's Date: 11/17/2011  Problem List:  Patient Active Problem List  Diagnoses  . Arthritis of knee, right    Past Medical History: History reviewed. No pertinent past medical history. Past Surgical History:  Past Surgical History  Procedure Date  . Rotator cuff repair 05/2010    PT Assessment/Plan/Recommendation PT Assessment Clinical Impression Statement: 70 yo female s/p RTKA presents with decr functional mobility and impairments listed below; will benefit from acute PT to maximize independence and safety with mobility and amb/transfers/ activity tol/ steps to enable safe dc home with prn family assist PT Recommendation/Assessment: Patient will need skilled PT in the acute care venue PT Problem List: Decreased strength;Decreased range of motion;Decreased activity tolerance;Decreased mobility;Decreased knowledge of use of DME;Pain;Decreased knowledge of precautions PT Therapy Diagnosis : Difficulty walking;Abnormality of gait;Acute pain PT Plan PT Frequency: 7X/week PT Treatment/Interventions: DME instruction;Gait training;Stair training;Functional mobility training;Therapeutic activities;Therapeutic exercise;Patient/family education PT Recommendation Recommendations for Other Services: OT consult Follow Up Recommendations: Home health PT;24 hour supervision/assistance Equipment Recommended: Rolling walker with 5" wheels;3 in 1 bedside comode PT Goals  Acute Rehab PT Goals PT Goal Formulation: With patient Time For Goal Achievement: 7 days Pt will go Supine/Side to Sit: with supervision PT Goal: Supine/Side to Sit - Progress: Progressing toward goal Pt will go Sit to Supine/Side: with supervision PT Goal: Sit to Supine/Side - Progress: Progressing toward goal Pt will go Sit to Stand: with supervision PT Goal: Sit to Stand - Progress: Progressing toward goal Pt will go Stand to  Sit: with supervision Pt will Ambulate: >150 feet;with supervision;with rolling walker Pt will Go Up / Down Stairs: 1-2 stairs;with min assist;with rolling walker Pt will Perform Home Exercise Program: Independently PT Goal: Perform Home Exercise Program - Progress: Progressing toward goal  PT Evaluation Precautions/Restrictions  Precautions Precautions: Knee Required Braces or Orthoses: Yes Knee Immobilizer: On when out of bed or walking Restrictions RLE Weight Bearing: Weight bearing as tolerated Prior Functioning  Home Living Lives With: Spouse;Family Receives Help From: Family Type of Home: House Home Layout: One level Home Access: Stairs to enter Entrance Stairs-Rails: None Entrance Stairs-Number of Steps: 2 Bathroom Toilet: Handicapped height Home Adaptive Equipment: Walker - rolling (husband will look for one they think they have) Prior Function Level of Independence: Independent with homemaking with ambulation Comments: 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 [employer] Arousal/Alertness: Awake/alert Overall Cognitive Status: Appears within functional limits for tasks assessed Orientation Level: Oriented X4 Sensation/Coordination Sensation Light Touch: Appears Intact Coordination Gross Motor Movements are Fluid and Coordinated: Yes Fine Motor Movements are Fluid and Coordinated: Yes Extremity Assessment RUE Assessment RUE Assessment: Within Functional Limits LUE Assessment LUE Assessment: Within Functional Limits RLE Assessment RLE Assessment: Exceptions to [facility] RLE Strength RLE Overall Strength Comments: grossly decr AROM and strength, limited by pain postop; AAROM flex to approx 60 degrees; positive quad activation LLE Assessment LLE Assessment: Within Functional Limits Mobility (including Balance) Bed Mobility Bed Mobility: Yes Supine to Sit: 3: Mod assist;HOB flat;With rails Supine to Sit Details (indicate cue type and reason): cues  for safe transfer Sitting - Scoot to Edge of Bed: 4: Min assist Sitting - Scoot to Edge of Bed Details (indicate cue type and reason): cues for ease with scooting Transfers Transfers: Yes Sit to Stand: 4: Min assist;From bed;With upper extremity assist Sit to Stand Details (indicate cue type and reason): cues for safe technique an dsafe hand  placement Stand to Sit: 4: Min assist;To chair/3-in-1 Stand to Sit Details: cues for safe technique and hand placement Ambulation/Gait Ambulation/Gait: Yes Ambulation/Gait Assistance: 4: Min assist Ambulation/Gait Assistance Details (indicate cue type and reason): cues for sequence, to activate quad in right stance, to self monitor for activity tolerance Ambulation Distance (Feet): 22 Feet Assistive device: Rolling walker Gait Pattern: Step-to pattern    Exercise  Total Joint Exercises Quad Sets: AROM;Right;10 reps;Supine Heel Slides: AAROM;Right;10 reps;Supine End of Session PT - End of Session Equipment Utilized During Treatment: Gait belt Activity Tolerance: Patient limited by fatigue (limited by dizziness) Patient left: in chair;with call bell in reach;with family/visitor present Nurse Communication: Mobility status for transfers;Mobility status for ambulation General Behavior During Session: [facility] for tasks performed Cognition: [facility] for tasks performed  Van Clines [facility] [place], [place] [phone]  11/17/2011, 2:31 PM

## 2011-11-17 NOTE — Progress Notes (Signed)
Subjective: 1 Day Post-Op Procedure(s) (LRB): COMPUTER ASSISTED TOTAL KNEE ARTHROPLASTY (Right) Patient reports pain as moderate. Post right total knee replacement.  Objective: Vital signs in last 24 hours: Temp:  [97.5 F (36.4 C)-99.1 F (37.3 C)] 98.9 F (37.2 C) (12/12 0618) Pulse Rate:  [70-87] 84  (12/12 0618) Resp:  [10-22] 18  (12/12 0618) BP: (112-165)/(63-114) 112/70 mmHg (12/12 0618) SpO2:  [93 %-100 %] 96 % (12/12 0618) Weight:  [84.9 kg (187 lb 2.7 oz)] 187 lb 2.7 oz (84.9 kg) (12/11 2100)  Intake/Output from previous day: 12/11 0701 - 12/12 0700 In: 3715 [P.O.:240; I.V.:2900] Out: 875 [Urine:600; Drains:175; Blood:100] Intake/Output this shift:     Basename 11/17/11 0525  HGB 10.8*    Basename 11/17/11 0525  WBC 7.4  RBC 3.40*  HCT 31.7*  PLT 268   No results found for this basename: NA:2,K:2,CL:2,CO2:2,BUN:2,CREATININE:2,GLUCOSE:2,CALCIUM:2 in the last 72 hours No results found for this basename: LABPT:2,INR:2 in the last 72 hours  Sensation intact distally Intact pulses distally Dorsiflexion/Plantar flexion intact Incision: scant drainage Compartment soft  Assessment/Plan: 1 Day Post-Op Procedure(s) (LRB): COMPUTER ASSISTED TOTAL KNEE ARTHROPLASTY (Right) Up with therapy  Raksha Wolfgang Y 11/17/2011, 7:59 AM

## 2011-11-17 NOTE — Plan of Care (Signed)
Problem: Consults Goal: Diagnosis- Total Joint Replacement Outcome: Completed/Met Date Met:  11/17/11 Primary Total Knee

## 2011-11-18 ENCOUNTER — Encounter (HOSPITAL_COMMUNITY): Payer: Self-pay | Admitting: Orthopaedic Surgery

## 2011-11-18 LAB — CBC
HCT: 28.5 % — ABNORMAL LOW (ref 36.0–46.0)
Hemoglobin: 9.4 g/dL — ABNORMAL LOW (ref 12.0–15.0)
WBC: 6 10*3/uL (ref 4.0–10.5)

## 2011-11-18 MED ORDER — METHOCARBAMOL 500 MG PO TABS: 500.0000 mg | ORAL_TABLET | Freq: Four times a day (QID) | ORAL | Status: AC | PRN

## 2011-11-18 MED ORDER — RIVAROXABAN 10 MG PO TABS: 10.0000 mg | ORAL_TABLET | Freq: Every day | ORAL | Status: DC

## 2011-11-18 MED ORDER — HYDROCODONE-ACETAMINOPHEN 5-325 MG PO TABS: 1.0000 | ORAL_TABLET | ORAL | Status: AC | PRN

## 2011-11-18 NOTE — Progress Notes (Signed)
I confirmed with patient that she would prefer to use [facility] for [facility]. I contacted Genevieve Norlander and informed them of patient's eminent discharge. I contacted Jill Alexanders of [facility] to deliver a rolling walker and continuous passive motion machine. Attending nurse, Sharyl Nimrod, is aware.

## 2011-11-18 NOTE — Progress Notes (Signed)
Physical Therapy Treatment Patient Details Name: Chelsea Finley MRN: [id number] DOB: [birth year] Today's Date: 11/18/2011  PT Assessment/Plan  PT - Assessment/Plan Comments on Treatment Session: Pt progressing very well. Declined TherEx after stair training PT Plan: Discharge plan remains appropriate PT Frequency: 7X/week Follow Up Recommendations: Home health PT Equipment Recommended: Rolling walker with 5" wheels;3 in 1 bedside comode PT Goals  Acute Rehab PT Goals PT Goal: Sit to Stand - Progress: Met PT Goal: Stand to Sit - Progress: Met PT Goal: Ambulate - Progress: Met PT Goal: Up/Down Stairs - Progress: Met  PT Treatment Precautions/Restrictions  Precautions Precautions: Knee Required Braces or Orthoses: Yes Knee Immobilizer: On when out of bed or walking Restrictions Weight Bearing Restrictions: Yes RLE Weight Bearing: Weight bearing as tolerated Mobility (including Balance) Bed Mobility Bed Mobility: No Transfers Sit to Stand: 5: Supervision;With armrests;From chair/3-in-1;With upper extremity assist Stand to Sit: 5: Supervision;With armrests;To chair/3-in-1;With upper extremity assist Ambulation/Gait Ambulation/Gait: Yes Ambulation/Gait Assistance: 5: Supervision Ambulation/Gait Assistance Details (indicate cue type and reason): Cues for posture Ambulation Distance (Feet): 200 Feet Assistive device: Rolling walker Gait Pattern: Step-to pattern;Trunk flexed;Decreased stride length Stairs: Yes Stairs Assistance: 4: Min assist Stairs Assistance Details (indicate cue type and reason): A for RW. Cues for sequency and technique Stair Management Technique: Step to pattern;Backwards;No rails;With walker Number of Stairs: 2     Exercise    End of Session PT - End of Session Equipment Utilized During Treatment: Gait belt Activity Tolerance: Patient tolerated treatment well Patient left: in chair;with call bell in reach Nurse Communication: Mobility status for  transfers;Mobility status for ambulation General Behavior During Session: [facility] for tasks performed Cognition: [facility] for tasks performed  Maddeline Roorda, Adline Potter 11/18/2011, 12:11 PM 11/18/2011 Fredrich Birks PTA [phone] pager [phone] office

## 2011-11-18 NOTE — Discharge Summary (Signed)
Physician Discharge Summary  Patient ID: Chelsea Finley MRN: [id number] DOB/AGE: 71/28/1942 71 y.o.  Admit date: 11/16/2011 Discharge date: 11/18/2011  Admission Diagnoses:  Arthritis of knee, right  Discharge Diagnoses:  Principal Problem:  *Arthritis of knee, right   History reviewed. No pertinent past medical history.  Surgeries: Procedure(s): COMPUTER ASSISTED TOTAL KNEE ARTHROPLASTY on 11/16/2011   Consultants (if any):    Discharged Condition: Improved  Hospital Course: Chelsea Finley is an 70 y.o. female who was admitted 11/16/2011 with a diagnosis of Arthritis of knee, right and went to the operating room on 11/16/2011 and underwent the above named procedures.    She was given perioperative antibiotics:  Anti-infectives     Start     Dose/Rate Route Frequency Ordered Stop   11/16/11 2100   ceFAZolin (ANCEF) IVPB 1 g/50 mL premix        1 g 100 mL/hr over 30 Minutes Intravenous Every 6 hours 11/16/11 1952 11/17/11 0942   11/15/11 1545   ceFAZolin (ANCEF) IVPB 2 g/50 mL premix        2 g 100 mL/hr over 30 Minutes Intravenous 60 min pre-op 11/15/11 1543 11/16/11 1158        .  She was given sequential compression devices, early ambulation, and chemoprophylaxis for DVT prophylaxis.  They benefited maximally from their hospital stay and there were no complications.    Recent vital signs:  Filed Vitals:   11/18/11 0601  BP: 135/76  Pulse: 75  Temp: 98.7 F (37.1 C)  Resp: 16    Recent laboratory studies:  Lab Results  Component Value Date   HGB 9.4* 11/18/2011   HGB 10.8* 11/17/2011   HGB 14.7 11/10/2011   Lab Results  Component Value Date   WBC 6.0 11/18/2011   PLT 248 11/18/2011   Lab Results  Component Value Date   INR 1.00 11/10/2011   Lab Results  Component Value Date   NA 140 11/17/2011   K 3.2* 11/17/2011   CL 105 11/17/2011   CO2 26 11/17/2011   BUN 11 11/17/2011   CREATININE 0.68 11/17/2011   GLUCOSE 117* 11/17/2011     Discharge Medications:   Current Discharge Medication List    START taking these medications   Details  HYDROcodone-acetaminophen (NORCO) 5-325 MG per tablet Take 1-2 tablets by mouth every 4 (four) hours as needed. Qty: 60 tablet, Refills: 0    methocarbamol (ROBAXIN) 500 MG tablet Take 1 tablet (500 mg total) by mouth every 6 (six) hours as needed. Qty: 60 tablet, Refills: 0    rivaroxaban (XARELTO) 10 MG TABS tablet Take 1 tablet (10 mg total) by mouth daily with breakfast. Qty: 10 tablet, Refills: 0      CONTINUE these medications which have NOT CHANGED   Details  traMADol (ULTRAM) 50 MG tablet Take 50-100 mg by mouth 2 (two) times daily as needed. Maximum dose= 8 tablets per day For pain         Diagnostic Studies: Dg Chest 2 View  11/10/2011  *RADIOLOGY REPORT*  Clinical Data: Pre admission for knee replacement.  CHEST - 2 VIEW  Comparison: 05/06/2010 and 12/29/2008.  Findings: No infiltrate, congestive heart failure or pneumothorax. Mildly tortuous aorta.  Heart size within normal limits.  Prior right shoulder replacement.  Marked left shoulder joint degenerative changes.  Calcified T10-11 and disc.  Mild loss of height T10 and superior plate W09.  IMPRESSION: No acute abnormality.  Original Report Authenticated By: Fuller Canada, M.D.  X-ray Knee Right Port  11/16/2011  *RADIOLOGY REPORT*  Clinical Data: Right knee arthroplasty.  Postop.  PORTABLE RIGHT KNEE - 1-2 VIEW  Comparison: None.  Findings: Status post right knee arthroplasty.  No acute hardware complication identified.  Expected postoperative swelling and gas about the knee.  Surgical drain in place.  IMPRESSION: Expected appearance after right knee arthroplasty.  Original Report Authenticated By: Consuello Bossier, M.D.    Disposition: discharge to home       Signed: Kathryne Hitch 11/18/2011, 9:49 AM

## 2011-11-18 NOTE — Progress Notes (Signed)
OT Note OT order received.  Pt is performing ADLs/functional transfers at supervision level and will have necessary level of assistance upon d/c home with family.  Will not follow acutely. Thanks. 11/18/2011 Cipriano Mile OTR/L Pager [phone] Office [phone]

## 2012-01-19 ENCOUNTER — Encounter (HOSPITAL_COMMUNITY): Payer: Self-pay | Admitting: Pharmacy Technician

## 2012-01-24 ENCOUNTER — Other Ambulatory Visit (HOSPITAL_COMMUNITY): Payer: Self-pay | Admitting: Orthopaedic Surgery

## 2012-01-25 ENCOUNTER — Encounter (HOSPITAL_COMMUNITY)
Admission: RE | Admit: 2012-01-25 | Discharge: 2012-01-25 | Disposition: A | Discharge status: Home / Self Care | Payer: Medicare Other | Source: Ambulatory Visit | Attending: Orthopaedic Surgery | Admitting: Orthopaedic Surgery

## 2012-01-25 ENCOUNTER — Encounter (HOSPITAL_COMMUNITY): Payer: Self-pay

## 2012-01-25 HISTORY — DX: Unspecified osteoarthritis, unspecified site: M19.90

## 2012-01-25 LAB — BASIC METABOLIC PANEL
BUN: 12 mg/dL (ref 6–23)
Chloride: 102 mEq/L (ref 96–112)
GFR calc Af Amer: >90 mL/min (ref >90)
Glucose, Bld: 96 mg/dL (ref 70–99)
Potassium: 3.5 mEq/L (ref 3.5–5.1)

## 2012-01-25 LAB — URINALYSIS, MICROSCOPIC ONLY
Bilirubin Urine: NEGATIVE
Hgb urine dipstick: NEGATIVE
Nitrite: NEGATIVE
Specific Gravity, Urine: <1.005 — ABNORMAL LOW (ref 1.005–1.030)
pH: 6 (ref 5.0–8.0)

## 2012-01-25 LAB — SURGICAL PCR SCREEN: Staphylococcus aureus: NEGATIVE

## 2012-01-25 LAB — CBC
HCT: 41.6 % (ref 36.0–46.0)
Hemoglobin: 13.5 g/dL (ref 12.0–15.0)
WBC: 5.1 10*3/uL (ref 4.0–10.5)

## 2012-01-25 NOTE — Progress Notes (Deleted)
PT  DOES NOT WANT TO WEAR THE BLOOD BAND TO CHURCH......SHE UNDERSTANDS SHE WILL HAVE ANOTHER SAMPLE DRAWN  DOS

## 2012-01-25 NOTE — Pre-Procedure Instructions (Signed)
20 MAKITA BLOW  01/25/2012   Your procedure is scheduled on:  Tuesday, February 26TH  Report to [facility] at 10:30 AM.  Call this number if you have problems the morning of surgery: [phone]   Remember:   Do not eat food: AFTER MIDNIGHT MONDAY  May have clear liquids: up to 4 Hours before arrival time...6:30 AM.   Clear liquids include soda, tea, black coffee, apple or grape juice, broth.   Take these medicines the morning of surgery with A SIP OF WATER:  NOTHING   Do not wear jewelry, make-up or nail polish.   Do not wear lotions, powders, or perfumes. You may wear deodorant.   Do not shave 48 hours prior to surgery.             DO NOT  BRING  VALUABLES WITH YOU    Contacts, dentures or bridgework may not be worn into surgery.  Leave suitcase in the car. After surgery it may be brought to your room.  For patients admitted to the hospital, checkout time is 11:00 AM the day of discharge.   Patients discharged the day of surgery will not be allowed to drive home.  Name and phone number of your driver:  BOBBY  Snedeker  -  SPOUSE  Special Instructions: CHG Shower Use Special Wash: 1/2 bottle night before surgery and 1/2 bottle morning of surgery.   Please read over the following fact sheets that you were given: Pain Booklet, Blood Transfusion Information, MRSA Information and Surgical Site Infection Prevention

## 2012-01-31 MED ORDER — CEFAZOLIN SODIUM-DEXTROSE 2-3 GM-% IV SOLR
2.0000 g | INTRAVENOUS | Status: AC
  Administered 2012-02-01: 2 g via INTRAVENOUS
  Filled 2012-01-31: qty 50

## 2012-02-01 ENCOUNTER — Encounter (HOSPITAL_COMMUNITY): Payer: Self-pay | Admitting: *Deleted

## 2012-02-01 ENCOUNTER — Encounter (HOSPITAL_COMMUNITY): Payer: Self-pay | Admitting: Certified Registered"

## 2012-02-01 ENCOUNTER — Encounter (HOSPITAL_COMMUNITY): Payer: Self-pay | Admitting: Surgery

## 2012-02-01 ENCOUNTER — Encounter (HOSPITAL_COMMUNITY)
Admission: RE | Disposition: A | Discharge status: Home Health | Payer: Self-pay | Source: Ambulatory Visit | Attending: Orthopaedic Surgery

## 2012-02-01 ENCOUNTER — Inpatient Hospital Stay (HOSPITAL_COMMUNITY)
Admission: RE | Admit: 2012-02-01 | Discharge: 2012-02-03 | DRG: 470 | Disposition: A | Discharge status: Home Health | Payer: Medicare Other | Source: Ambulatory Visit | Attending: Orthopaedic Surgery | Admitting: Orthopaedic Surgery

## 2012-02-01 ENCOUNTER — Ambulatory Visit (HOSPITAL_COMMUNITY): Payer: Medicare Other

## 2012-02-01 ENCOUNTER — Ambulatory Visit (HOSPITAL_COMMUNITY): Payer: Medicare Other | Admitting: Certified Registered"

## 2012-02-01 DIAGNOSIS — M1712 Unilateral primary osteoarthritis, left knee: Secondary | ICD-10-CM

## 2012-02-01 DIAGNOSIS — Z01812 Encounter for preprocedural laboratory examination: Secondary | ICD-10-CM

## 2012-02-01 DIAGNOSIS — Z96659 Presence of unspecified artificial knee joint: Secondary | ICD-10-CM

## 2012-02-01 DIAGNOSIS — M1710 Unilateral primary osteoarthritis, unspecified knee: Principal | ICD-10-CM | POA: Diagnosis present

## 2012-02-01 HISTORY — PX: KNEE ARTHROPLASTY: SHX992

## 2012-02-01 HISTORY — DX: Unilateral primary osteoarthritis, left knee: M17.12

## 2012-02-01 SURGERY — ARTHROPLASTY, KNEE, TOTAL, USING IMAGELESS COMPUTER-ASSISTED NAVIGATION
Anesthesia: General | Site: Knee | Laterality: Left | Wound class: Clean

## 2012-02-01 MED ORDER — SODIUM CHLORIDE 0.9 % IJ SOLN: 9.0000 mL | INTRAMUSCULAR | Status: DC | PRN

## 2012-02-01 MED ORDER — OXYCODONE HCL 5 MG PO TABS: 5.0000 mg | ORAL_TABLET | ORAL | Status: DC | PRN

## 2012-02-01 MED ORDER — GLYCOPYRROLATE 0.2 MG/ML IJ SOLN
INTRAMUSCULAR | Status: DC | PRN
  Administered 2012-02-01: .5 mg via INTRAVENOUS

## 2012-02-01 MED ORDER — DIPHENHYDRAMINE HCL 12.5 MG/5ML PO ELIX: 12.5000 mg | ORAL_SOLUTION | ORAL | Status: DC | PRN

## 2012-02-01 MED ORDER — MIDAZOLAM HCL 5 MG/5ML IJ SOLN
INTRAMUSCULAR | Status: DC | PRN
  Administered 2012-02-01: 2 mg via INTRAVENOUS

## 2012-02-01 MED ORDER — DEXTROSE 5 % IV SOLN
500.0000 mg | Freq: Four times a day (QID) | INTRAVENOUS | Status: DC | PRN
  Administered 2012-02-01: 500 mg via INTRAVENOUS
  Filled 2012-02-01: qty 5

## 2012-02-01 MED ORDER — CEFAZOLIN SODIUM 1-5 GM-% IV SOLN
1.0000 g | Freq: Four times a day (QID) | INTRAVENOUS | Status: AC
  Administered 2012-02-01 – 2012-02-02 (×3): 1 g via INTRAVENOUS
  Filled 2012-02-01 (×3): qty 50

## 2012-02-01 MED ORDER — ONDANSETRON HCL 4 MG PO TABS
4.0000 mg | ORAL_TABLET | Freq: Four times a day (QID) | ORAL | Status: DC | PRN
  Administered 2012-02-02: 4 mg via ORAL
  Filled 2012-02-01: qty 1

## 2012-02-01 MED ORDER — SODIUM CHLORIDE 0.9 % IR SOLN
Status: DC | PRN
  Administered 2012-02-01: 3000 mL

## 2012-02-01 MED ORDER — PROPOFOL 10 MG/ML IV EMUL
INTRAVENOUS | Status: DC | PRN
  Administered 2012-02-01: 160 mg via INTRAVENOUS

## 2012-02-01 MED ORDER — ZOLPIDEM TARTRATE 5 MG PO TABS: 5.0000 mg | ORAL_TABLET | Freq: Every evening | ORAL | Status: DC | PRN

## 2012-02-01 MED ORDER — SODIUM CHLORIDE 0.9 % IV SOLN
INTRAVENOUS | Status: DC
  Administered 2012-02-01 – 2012-02-02 (×3): via INTRAVENOUS

## 2012-02-01 MED ORDER — ONDANSETRON HCL 4 MG/2ML IJ SOLN
4.0000 mg | Freq: Four times a day (QID) | INTRAMUSCULAR | Status: DC | PRN
  Administered 2012-02-01: 4 mg via INTRAVENOUS
  Filled 2012-02-01: qty 2

## 2012-02-01 MED ORDER — METHOCARBAMOL 500 MG PO TABS
500.0000 mg | ORAL_TABLET | Freq: Four times a day (QID) | ORAL | Status: DC | PRN
  Administered 2012-02-02 – 2012-02-03 (×3): 500 mg via ORAL
  Filled 2012-02-01 (×4): qty 1

## 2012-02-01 MED ORDER — LACTATED RINGERS IV SOLN
INTRAVENOUS | Status: DC | PRN
  Administered 2012-02-01 (×3): via INTRAVENOUS

## 2012-02-01 MED ORDER — FENTANYL CITRATE 0.05 MG/ML IJ SOLN
INTRAMUSCULAR | Status: DC | PRN
  Administered 2012-02-01 (×3): 100 ug via INTRAVENOUS
  Administered 2012-02-01: 50 ug via INTRAVENOUS

## 2012-02-01 MED ORDER — ONDANSETRON HCL 4 MG/2ML IJ SOLN: 4.0000 mg | Freq: Four times a day (QID) | INTRAMUSCULAR | Status: DC | PRN

## 2012-02-01 MED ORDER — ACETAMINOPHEN 325 MG PO TABS: 650.0000 mg | ORAL_TABLET | Freq: Four times a day (QID) | ORAL | Status: DC | PRN

## 2012-02-01 MED ORDER — METOCLOPRAMIDE HCL 5 MG/ML IJ SOLN: 5.0000 mg | Freq: Three times a day (TID) | INTRAMUSCULAR | Status: DC | PRN

## 2012-02-01 MED ORDER — NALOXONE HCL 0.4 MG/ML IJ SOLN: 0.4000 mg | INTRAMUSCULAR | Status: DC | PRN

## 2012-02-01 MED ORDER — RIVAROXABAN 10 MG PO TABS
10.0000 mg | ORAL_TABLET | Freq: Every day | ORAL | Status: DC
  Administered 2012-02-02 – 2012-02-03 (×2): 10 mg via ORAL
  Filled 2012-02-01 (×3): qty 1

## 2012-02-01 MED ORDER — FENTANYL CITRATE 0.05 MG/ML IJ SOLN
50.0000 ug | INTRAMUSCULAR | Status: DC | PRN
  Administered 2012-02-01: 100 ug via INTRAVENOUS

## 2012-02-01 MED ORDER — ROCURONIUM BROMIDE 100 MG/10ML IV SOLN
INTRAVENOUS | Status: DC | PRN
  Administered 2012-02-01: 10 mg via INTRAVENOUS
  Administered 2012-02-01: 40 mg via INTRAVENOUS
  Administered 2012-02-01: 10 mg via INTRAVENOUS

## 2012-02-01 MED ORDER — ONDANSETRON HCL 4 MG/2ML IJ SOLN
INTRAMUSCULAR | Status: DC | PRN
  Administered 2012-02-01: 4 mg via INTRAVENOUS

## 2012-02-01 MED ORDER — BUPIVACAINE-EPINEPHRINE PF 0.5-1:200000 % IJ SOLN
INTRAMUSCULAR | Status: DC | PRN
  Administered 2012-02-01: 30 mL

## 2012-02-01 MED ORDER — ALUM & MAG HYDROXIDE-SIMETH 200-200-20 MG/5ML PO SUSP: 30.0000 mL | ORAL | Status: DC | PRN

## 2012-02-01 MED ORDER — HYDROMORPHONE HCL PF 1 MG/ML IJ SOLN
INTRAMUSCULAR | Status: AC
  Administered 2012-02-01: 0.5 mg via INTRAVENOUS
  Filled 2012-02-01: qty 1

## 2012-02-01 MED ORDER — HYDROMORPHONE HCL PF 1 MG/ML IJ SOLN
0.2500 mg | INTRAMUSCULAR | Status: DC | PRN
  Administered 2012-02-01 (×4): 0.5 mg via INTRAVENOUS

## 2012-02-01 MED ORDER — MIDAZOLAM HCL 2 MG/2ML IJ SOLN
INTRAMUSCULAR | Status: AC
  Filled 2012-02-01: qty 2

## 2012-02-01 MED ORDER — PHENOL 1.4 % MT LIQD: 1.0000 | OROMUCOSAL | Status: DC | PRN

## 2012-02-01 MED ORDER — HYDROMORPHONE 0.3 MG/ML IV SOLN
INTRAVENOUS | Status: DC
  Administered 2012-02-01: 19:00:00 via INTRAVENOUS
  Administered 2012-02-01: 0.4 mg via INTRAVENOUS
  Administered 2012-02-02: 1.19 mg via INTRAVENOUS
  Administered 2012-02-02: 1.39 mg via INTRAVENOUS
  Administered 2012-02-02: 0.6 mg via INTRAVENOUS
  Administered 2012-02-02: 1.39 mg via INTRAVENOUS

## 2012-02-01 MED ORDER — MENTHOL 3 MG MT LOZG: 1.0000 | LOZENGE | OROMUCOSAL | Status: DC | PRN

## 2012-02-01 MED ORDER — HYDROMORPHONE HCL PF 1 MG/ML IJ SOLN
1.0000 mg | INTRAMUSCULAR | Status: DC | PRN
  Administered 2012-02-01: 1 mg via INTRAVENOUS

## 2012-02-01 MED ORDER — HYDROMORPHONE HCL PF 1 MG/ML IJ SOLN
0.5000 mg | INTRAMUSCULAR | Status: DC | PRN
  Administered 2012-02-01: 0.5 mg via INTRAVENOUS

## 2012-02-01 MED ORDER — HYDROMORPHONE HCL PF 1 MG/ML IJ SOLN: 1.0000 mg | INTRAMUSCULAR | Status: DC | PRN

## 2012-02-01 MED ORDER — ONDANSETRON HCL 4 MG/2ML IJ SOLN: 4.0000 mg | Freq: Once | INTRAMUSCULAR | Status: DC | PRN

## 2012-02-01 MED ORDER — DOCUSATE SODIUM 100 MG PO CAPS
100.0000 mg | ORAL_CAPSULE | Freq: Two times a day (BID) | ORAL | Status: DC
  Administered 2012-02-01 – 2012-02-03 (×4): 100 mg via ORAL
  Filled 2012-02-01 (×5): qty 1

## 2012-02-01 MED ORDER — METOCLOPRAMIDE HCL 10 MG PO TABS: 5.0000 mg | ORAL_TABLET | Freq: Three times a day (TID) | ORAL | Status: DC | PRN

## 2012-02-01 MED ORDER — DIPHENHYDRAMINE HCL 50 MG/ML IJ SOLN: 12.5000 mg | Freq: Four times a day (QID) | INTRAMUSCULAR | Status: DC | PRN

## 2012-02-01 MED ORDER — MORPHINE SULFATE 2 MG/ML IJ SOLN: 1.0000 mg | INTRAMUSCULAR | Status: DC | PRN

## 2012-02-01 MED ORDER — HYDROMORPHONE HCL PF 1 MG/ML IJ SOLN
INTRAMUSCULAR | Status: AC
  Filled 2012-02-01: qty 1

## 2012-02-01 MED ORDER — SCOPOLAMINE 1 MG/3DAYS TD PT72
MEDICATED_PATCH | TRANSDERMAL | Status: DC | PRN
  Administered 2012-02-01: 1 via TRANSDERMAL

## 2012-02-01 MED ORDER — DIPHENHYDRAMINE HCL 12.5 MG/5ML PO ELIX: 12.5000 mg | ORAL_SOLUTION | Freq: Four times a day (QID) | ORAL | Status: DC | PRN

## 2012-02-01 MED ORDER — NEOSTIGMINE METHYLSULFATE 1 MG/ML IJ SOLN
INTRAMUSCULAR | Status: DC | PRN
  Administered 2012-02-01: 3 mg via INTRAVENOUS

## 2012-02-01 MED ORDER — MIDAZOLAM HCL 2 MG/2ML IJ SOLN
1.0000 mg | INTRAMUSCULAR | Status: DC | PRN
  Administered 2012-02-01: 2 mg via INTRAVENOUS

## 2012-02-01 MED ORDER — HYDROCODONE-ACETAMINOPHEN 5-325 MG PO TABS
1.0000 | ORAL_TABLET | ORAL | Status: DC | PRN
  Administered 2012-02-02 – 2012-02-03 (×7): 2 via ORAL
  Filled 2012-02-01 (×7): qty 2

## 2012-02-01 MED ORDER — ACETAMINOPHEN 650 MG RE SUPP: 650.0000 mg | Freq: Four times a day (QID) | RECTAL | Status: DC | PRN

## 2012-02-01 MED ORDER — FENTANYL CITRATE 0.05 MG/ML IJ SOLN
INTRAMUSCULAR | Status: AC
  Filled 2012-02-01: qty 2

## 2012-02-01 MED ORDER — HYDROMORPHONE 0.3 MG/ML IV SOLN
INTRAVENOUS | Status: AC
  Filled 2012-02-01: qty 25

## 2012-02-01 MED ORDER — LACTATED RINGERS IV SOLN
INTRAVENOUS | Status: DC
  Administered 2012-02-01: 12:00:00 via INTRAVENOUS

## 2012-02-01 MED ORDER — MEPERIDINE HCL 25 MG/ML IJ SOLN: 6.2500 mg | INTRAMUSCULAR | Status: DC | PRN

## 2012-02-01 SURGICAL SUPPLY — 71 items
BANDAGE ELASTIC 6 VELCRO ST LF (GAUZE/BANDAGES/DRESSINGS) ×3 IMPLANT
BANDAGE ESMARK 6X9 LF (GAUZE/BANDAGES/DRESSINGS) ×1 IMPLANT
BLADE SAGITTAL 25.0X1.19X90 (BLADE) ×4 IMPLANT
BLADE SAW SGTL 13.0X1.19X90.0M (BLADE) ×2 IMPLANT
BLADE SURG 10 STRL SS (BLADE) ×2 IMPLANT
BNDG CMPR 9X6 STRL LF SNTH (GAUZE/BANDAGES/DRESSINGS) ×1
BNDG ESMARK 6X9 LF (GAUZE/BANDAGES/DRESSINGS) ×2
BOWL SMART MIX CTS (DISPOSABLE) ×1 IMPLANT
CEMENT HV SMART SET (Cement) ×4 IMPLANT
CLOTH BEACON ORANGE TIMEOUT ST (SAFETY) ×2 IMPLANT
COVER BACK TABLE 24X17X13 BIG (DRAPES) IMPLANT
COVER SURGICAL LIGHT HANDLE (MISCELLANEOUS) ×2 IMPLANT
CUFF TOURNIQUET SINGLE 34IN LL (TOURNIQUET CUFF) ×1 IMPLANT
CUFF TOURNIQUET SINGLE 44IN (TOURNIQUET CUFF) IMPLANT
DRAPE INCISE IOBAN 66X45 STRL (DRAPES) ×2 IMPLANT
DRAPE ORTHO SPLIT 77X108 STRL (DRAPES) ×4
DRAPE PROXIMA HALF (DRAPES) ×2 IMPLANT
DRAPE SURG ORHT 6 SPLT 77X108 (DRAPES) ×2 IMPLANT
DRAPE U-SHAPE 47X51 STRL (DRAPES) ×2 IMPLANT
DRSG PAD ABDOMINAL 8X10 ST (GAUZE/BANDAGES/DRESSINGS) ×1 IMPLANT
DURAPREP 26ML APPLICATOR (WOUND CARE) ×2 IMPLANT
ELECT REM PT RETURN 9FT ADLT (ELECTROSURGICAL) ×2
ELECTRODE REM PT RTRN 9FT ADLT (ELECTROSURGICAL) ×1 IMPLANT
EVACUATOR 1/8 PVC DRAIN (DRAIN) ×2 IMPLANT
FACESHIELD LNG OPTICON STERILE (SAFETY) ×6 IMPLANT
GAUZE SPONGE 4X4 12PLY STRL LF (GAUZE/BANDAGES/DRESSINGS) ×1 IMPLANT
GAUZE XEROFORM 5X9 LF (GAUZE/BANDAGES/DRESSINGS) ×1 IMPLANT
GLOVE BIO SURGEON STRL SZ7.5 (GLOVE) ×2 IMPLANT
GLOVE BIOGEL PI IND STRL 7.0 (GLOVE) IMPLANT
GLOVE BIOGEL PI IND STRL 8 (GLOVE) ×1 IMPLANT
GLOVE BIOGEL PI INDICATOR 7.0 (GLOVE) ×1
GLOVE BIOGEL PI INDICATOR 8 (GLOVE) ×1
GLOVE ECLIPSE 7.0 STRL STRAW (GLOVE) ×2 IMPLANT
GLOVE ORTHO TXT STRL SZ7.5 (GLOVE) ×2 IMPLANT
GLOVE SURG SS PI 6.5 STRL IVOR (GLOVE) ×1 IMPLANT
GLOVE SURG SS PI 7.0 STRL IVOR (GLOVE) ×1 IMPLANT
GOWN PREVENTION PLUS LG XLONG (DISPOSABLE) IMPLANT
GOWN PREVENTION PLUS XLARGE (GOWN DISPOSABLE) ×2 IMPLANT
GOWN STRL NON-REIN LRG LVL3 (GOWN DISPOSABLE) ×5 IMPLANT
HANDPIECE INTERPULSE COAX TIP (DISPOSABLE) ×2
IMMOBILIZER KNEE 22 UNIV (SOFTGOODS) IMPLANT
KIT BASIN OR (CUSTOM PROCEDURE TRAY) ×2 IMPLANT
KIT ROOM TURNOVER OR (KITS) ×2 IMPLANT
MANIFOLD NEPTUNE II (INSTRUMENTS) ×2 IMPLANT
MARKER SPHERE PSV REFLC THRD 5 (MARKER) ×6 IMPLANT
NDL SPNL 18GX3.5 QUINCKE PK (NEEDLE) ×1 IMPLANT
NEEDLE SPNL 18GX3.5 QUINCKE PK (NEEDLE) ×2 IMPLANT
NS IRRIG 1000ML POUR BTL (IV SOLUTION) ×2 IMPLANT
PACK TOTAL JOINT (CUSTOM PROCEDURE TRAY) ×2 IMPLANT
PAD ARMBOARD 7.5X6 YLW CONV (MISCELLANEOUS) ×4 IMPLANT
PAD CAST 4YDX4 CTTN HI CHSV (CAST SUPPLIES) IMPLANT
PADDING CAST COTTON 4X4 STRL (CAST SUPPLIES) ×2
PADDING CAST COTTON 6X4 STRL (CAST SUPPLIES) ×1 IMPLANT
PIN SCHANZ 4MM 130MM (PIN) IMPLANT
SET HNDPC FAN SPRY TIP SCT (DISPOSABLE) IMPLANT
SET PAD KNEE POSITIONER (MISCELLANEOUS) ×1 IMPLANT
SPONGE GAUZE 4X4 12PLY (GAUZE/BANDAGES/DRESSINGS) ×1 IMPLANT
STAPLER VISISTAT 35W (STAPLE) ×2 IMPLANT
STRIP CLOSURE SKIN 1/2X4 (GAUZE/BANDAGES/DRESSINGS) ×2 IMPLANT
SUCTION FRAZIER TIP 10 FR DISP (SUCTIONS) ×1 IMPLANT
SUT ETHILON 3 0 PS 1 (SUTURE) ×2 IMPLANT
SUT VIC AB 1 CT1 27 (SUTURE) ×4
SUT VIC AB 1 CT1 27XBRD ANBCTR (SUTURE) ×2 IMPLANT
SUT VIC AB 2-0 CT1 27 (SUTURE) ×2
SUT VIC AB 2-0 CT1 TAPERPNT 27 (SUTURE) ×2 IMPLANT
SUT VIC AB 4-0 PS2 27 (SUTURE) ×2 IMPLANT
SWABSTICK BENZOIN STERILE (MISCELLANEOUS) ×1 IMPLANT
TOWEL OR 17X24 6PK STRL BLUE (TOWEL DISPOSABLE) ×2 IMPLANT
TOWEL OR 17X26 10 PK STRL BLUE (TOWEL DISPOSABLE) ×2 IMPLANT
TRAY FOLEY CATH 14FR (SET/KITS/TRAYS/PACK) ×1 IMPLANT
WATER STERILE IRR 1000ML POUR (IV SOLUTION) ×2 IMPLANT

## 2012-02-01 NOTE — Brief Op Note (Signed)
02/01/2012  3:07 PM  PATIENT:  Chelsea Finley  71 y.o. female  PRE-OPERATIVE DIAGNOSIS:  Severe arthritis left knee  POST-OPERATIVE DIAGNOSIS:  Severe arthritis left knee  PROCEDURE:  Procedure(s) (LRB): COMPUTER ASSISTED TOTAL KNEE ARTHROPLASTY (Left)  SURGEON:  Surgeon(s) and Role:    * Kathryne Hitch, MD - Primary  PHYSICIAN ASSISTANT:   ASSISTANTS: none   ANESTHESIA:   regional and general  EBL:  Total I/O In: 1000 [I.V.:1000] Out: 275 [Urine:225; Blood:50]  BLOOD ADMINISTERED:none  DRAINS: (medium) Hemovact drain(s) in the knee with  Suction Open   LOCAL MEDICATIONS USED:  NONE  SPECIMEN:  No Specimen  DISPOSITION OF SPECIMEN:  N/A  COUNTS:  YES  TOURNIQUET:  * Missing tourniquet times found for documented tourniquets in log:  22649 *  DICTATION: .Other Dictation: Dictation Number [id number]  PLAN OF CARE: Admit to inpatient   PATIENT DISPOSITION:  PACU - hemodynamically stable.   Delay start of Pharmacological VTE agent (>24hrs) due to surgical blood loss or risk of bleeding: yes

## 2012-02-01 NOTE — Anesthesia Preprocedure Evaluation (Addendum)
Anesthesia Evaluation  Patient identified by MRN, date of birth, ID band Patient awake    Reviewed: Allergy & Precautions, H&P , NPO status , Patient's Chart, lab work & pertinent test results  Airway Mallampati: I TM Distance: >3 FB Neck ROM: Full    Dental  (+) Missing, Dental Advisory Given and Partial Upper   Pulmonary  clear to auscultation        Cardiovascular     Neuro/Psych    GI/Hepatic   Endo/Other    Renal/GU      Musculoskeletal   Abdominal   Peds  Hematology   Anesthesia Other Findings   Reproductive/Obstetrics                           Anesthesia Physical Anesthesia Plan  ASA: II  Anesthesia Plan: General   Post-op Pain Management: MAC Combined w/ Regional for Post-op pain   Induction: Intravenous  Airway Management Planned: Oral ETT  Additional Equipment:   Intra-op Plan:   Post-operative Plan: Extubation in OR  Informed Consent: I have reviewed the patients History and Physical, chart, labs and discussed the procedure including the risks, benefits and alternatives for the proposed anesthesia with the patient or authorized representative who has indicated his/her understanding and acceptance.     Plan Discussed with: CRNA and Surgeon  Anesthesia Plan Comments:         Anesthesia Quick Evaluation

## 2012-02-01 NOTE — Preoperative (Signed)
Beta Blockers   Reason not to administer Beta Blockers:Not Applicable 

## 2012-02-01 NOTE — Plan of Care (Signed)
Problem: Consults Goal: Diagnosis- Total Joint Replacement Outcome: Completed/Met Date Met:  02/01/12 Primary Total Knee Left

## 2012-02-01 NOTE — Anesthesia Procedure Notes (Signed)
Anesthesia Regional Block:  Femoral nerve block  Pre-Anesthetic Checklist: ,, timeout performed, Correct Patient, Correct Site, Correct Laterality, Correct Procedure,, site marked, risks and benefits discussed, Surgical consent, Pre-op evaluation,  At surgeon's request  Laterality: Left  Prep: chloraprep       Needles:  Injection technique: Single-shot  Needle Type: Stimulator Needle - 40      Needle Gauge: 22 and 22 G    Additional Needles:  Procedures: nerve stimulator Femoral nerve block  Nerve Stimulator or Paresthesia:  Response: 0.4 mA,   Additional Responses:   Narrative:  Start time: 02/01/2012 11:45 AM End time: 02/01/2012 12:00 PM Injection made incrementally with aspirations every 5 mL.  Performed by: Personally  Anesthesiologist: Arta Bruce MD  Additional Notes: A functioning IV was confirmed and monitors were applied.  Sterile prep and drape, hand hygiene and sterile gloves were used.  Negative aspiration prior to incremental administration of local anesthetic using the 22 ga needle. The patient tolerated the procedure well.   Femoral nerve block

## 2012-02-01 NOTE — Progress Notes (Signed)
Orthopedic Tech Progress Note Patient Details:  Chelsea Finley [birth year] [id number] Applied patient helper trapeze with bar. CPM Left Knee CPM Left Knee: On Left Knee Flexion (Degrees): 0  Left Knee Extension (Degrees): 60  Additional Comments: put on at 1635       Jennye Moccasin 02/01/2012, 4:57 PM

## 2012-02-01 NOTE — Transfer of Care (Signed)
Immediate Anesthesia Transfer of Care Note  Patient: Chelsea Finley  Procedure(s) Performed: Procedure(s) (LRB): COMPUTER ASSISTED TOTAL KNEE ARTHROPLASTY (Left)  Patient Location: PACU  Anesthesia Type: General  Level of Consciousness: awake, alert  and oriented  Airway & Oxygen Therapy: Patient Spontanous Breathing and Patient connected to nasal cannula oxygen  Post-op Assessment: Report given to PACU RN  Post vital signs: Reviewed and stable  Complications: No apparent anesthesia complications

## 2012-02-01 NOTE — Anesthesia Postprocedure Evaluation (Signed)
Anesthesia Post Note  Patient: Chelsea Finley  Procedure(s) Performed: Procedure(s) (LRB): COMPUTER ASSISTED TOTAL KNEE ARTHROPLASTY (Left)  Anesthesia type: general  Patient location: PACU  Post pain: Pain level controlled  Post assessment: Patient's Cardiovascular Status Stable  Last Vitals:  Filed Vitals:   02/01/12 1521  BP:   Pulse:   Temp: 36.3 C  Resp:     Post vital signs: Reviewed and stable  Level of consciousness: sedated  Complications: No apparent anesthesia complications

## 2012-02-01 NOTE — H&P (Signed)
Chelsea Finley is an 71 y.o. female.   Chief Complaint:   Severe left knee pain with known end-stage OA HPI:   71 yo female with severe OA in both of her knees.  Recently underwent a successful right total knee replacement.  Now wishes to proceed with a left total knee.  Having had this before, she fully understands the risks and benefits involved.  Past Medical History  Diagnosis Date  . Arthritis     OSTEO ARTHRITIS    Past Surgical History  Procedure Date  . Rotator cuff repair 05/2010  . Knee arthroplasty 11/16/2011    Procedure: COMPUTER ASSISTED TOTAL KNEE ARTHROPLASTY;  Surgeon: Kathryne Hitch;  Location: [facility] OR;  Service: Orthopedics;  Laterality: Right;  Right total knee arthroplasty  . Appendectomy   . Tubal ligation     History reviewed. No pertinent family history. Social History:  reports that she has never smoked. She does not have any smokeless tobacco history on file. She reports that she does not drink alcohol or use illicit drugs.  Allergies:  Allergies  Allergen Reactions  . Morphine And Related Nausea And Vomiting    Medications Prior to Admission  Medication Dose Route Frequency Provider Last Rate Last Dose  . ceFAZolin (ANCEF) IVPB 2 g/50 mL premix  2 g Intravenous 60 min Pre-Op Kathryne Hitch, MD      . fentaNYL (SUBLIMAZE) injection 50-100 mcg  50-100 mcg Intravenous PRN Aubery Lapping, MD   100 mcg at 02/01/12 1203  . lactated ringers infusion   Intravenous Continuous Aubery Lapping, MD 50 mL/hr at 02/01/12 1203    . midazolam (VERSED) injection 1-2 mg  1-2 mg Intravenous PRN Aubery Lapping, MD   2 mg at 02/01/12 1203   No current outpatient prescriptions on file as of 02/01/2012.    No results found for this or any previous visit (from the past 48 hour(s)). No results found.  Review of Systems  All other systems reviewed and are negative.    Blood pressure 168/92, pulse 86, temperature 98.1 F (36.7 C), temperature source  Oral, resp. rate 16, SpO2 97.00%. Physical Exam  Constitutional: She is oriented to person, place, and time. She appears well-developed and well-nourished.  HENT:  Head: Normocephalic and atraumatic.  Eyes: EOM are normal. Pupils are equal, round, and reactive to light.  Neck: Normal range of motion. Neck supple.  Cardiovascular: Normal rate and regular rhythm.   Respiratory: Breath sounds normal.  GI: Soft. Bowel sounds are normal.  Musculoskeletal:       Left knee: She exhibits decreased range of motion, effusion and bony tenderness. tenderness found. Medial joint line and lateral joint line tenderness noted.  Neurological: She is alert and oriented to person, place, and time.  Skin: Skin is warm and dry.  Psychiatric: She has a normal mood and affect.     Assessment/Plan To the OR today for a left total knee replacement.  Kathryne Hitch 02/01/2012, 12:13 PM

## 2012-02-02 LAB — BASIC METABOLIC PANEL
BUN: 12 mg/dL (ref 6–23)
CO2: 28 mEq/L (ref 19–32)
Chloride: 104 mEq/L (ref 96–112)
Creatinine, Ser: 0.68 mg/dL (ref 0.50–1.10)
GFR calc Af Amer: >90 mL/min (ref >90)

## 2012-02-02 LAB — CBC
HCT: 30.9 % — ABNORMAL LOW (ref 36.0–46.0)
MCV: 88 fL (ref 78.0–100.0)
RDW: 13.6 % (ref 11.5–15.5)
WBC: 6.8 10*3/uL (ref 4.0–10.5)

## 2012-02-02 MED ORDER — WARFARIN - PHARMACIST DOSING INPATIENT
Freq: Every day | Status: DC
  Filled 2012-02-02: qty 1

## 2012-02-02 NOTE — Progress Notes (Signed)
Physical Therapy Evaluation Note  Past Medical History  Diagnosis Date  . Arthritis     OSTEO ARTHRITIS    Past Surgical History  Procedure Date  . Rotator cuff repair 05/2010  . Knee arthroplasty 11/16/2011    Procedure: COMPUTER ASSISTED TOTAL KNEE ARTHROPLASTY;  Surgeon: Kathryne Hitch;  Location: [facility] OR;  Service: Orthopedics;  Laterality: Right;  Right total knee arthroplasty  . Appendectomy   . Tubal ligation       02/02/12 0828  PT Visit Information  Last PT Received On 02/02/12  Precautions  Precautions Knee  Required Braces or Orthoses Yes  Knee Immobilizer On when out of bed or walking (L LE)  Restrictions  LLE Weight Bearing WBAT  Home Living  Lives With Spouse  Receives Help From Family  Type of Home House  Home Layout One level  Home Access Stairs to enter  Entrance Stairs-Rails None  Entrance Stairs-Number of Steps 2  Bathroom Shower/Tub Walk-in Pension scheme manager Yes  How Accessible Accessible via walker  Home Adaptive Equipment Bedside commode/3-in-1;Grab bars in shower;Walker - rolling (built in shower chair)  Additional Comments Patient had R TKA  12/11 so reports she remembers and is familiar with the process  Prior Function  Level of Independence Independent with basic ADLs;Independent with homemaking with ambulation;Independent with transfers;Independent with gait  Able to Take Stairs? Yes  Driving Yes  Cognition  Arousal/Alertness Awake/alert  Overall Cognitive Status Appears within functional limits for tasks assessed  Orientation Level Oriented X4  Sensation  Light Touch Appears Intact  Bed Mobility  Bed Mobility Yes  Supine to Sit 4: Min assist;With rails;HOB elevated (Comment degrees) (HOB 30 degrees)  Supine to Sit Details (indicate cue type and reason) assist for L LE management, strong use of bed rail  Transfers  Transfers Yes  Sit to Stand 3: Mod assist;Without upper extremity  assist;From bed  Sit to Stand Details (indicate cue type and reason) verbal cues for hand placement  Stand Pivot Transfers 4: Min assist  Stand Pivot Transfer Details (indicate cue type and reason) directional verbal cues for sequencing, encouraged UE WBing to assist with L LE pain management  Ambulation/Gait  Ambulation/Gait No (pt unable to tolerate further ambulation due to L knee pain)  Stairs No  Wheelchair Mobility  Wheelchair Mobility No  Posture/Postural Control  Posture/Postural Control No significant limitations  Balance  Balance Assessed No  RUE Assessment  RUE Assessment WFL  LUE Assessment  LUE Assessment WFL  RLE Assessment  RLE Assessment WFL  LLE Assessment  LLE Assessment Not tested (due to L s/p TKA)  Cervical Assessment  Cervical Assessment [facility]  Thoracic Assessment  Thoracic Assessment [facility]  Lumbar Assessment  Lumbar Assessment [facility]  Exercises  Exercises Total Joint  Total Joint Exercises  Ankle Circles/Pumps AROM;10 reps;Both;Supine  Quad Sets AROM;Left;10 reps;Supine  PT - End of Session  Equipment Utilized During Treatment Gait belt;Left knee immobilizer  Activity Tolerance Patient limited by pain  Patient left in chair;with call bell in reach;with family/visitor present  Nurse Communication Mobility status for transfers;Mobility status for ambulation  CPM Left Knee  CPM Left Knee Off  General  Behavior During Session [facility] for tasks performed  Cognition [facility] for tasks performed  PT Assessment  Clinical Impression Statement Patient s/p L TKA presenting with increased L knee pain and decreased L LE strength and active knee ROM. Patient recently had R TKA and is familar with rehab process.  PT  Recommendation/Assessment Patient will need skilled PT in the acute care venue  PT Problem List Decreased strength;Decreased activity tolerance;Decreased range of motion;Decreased balance;Decreased mobility;Decreased coordination  Barriers to Discharge None  PT  Therapy Diagnosis  Difficulty walking;Abnormality of gait;Generalized weakness;Acute pain  PT Plan  PT Frequency 7X/week  PT Treatment/Interventions DME instruction;Gait training;Stair training;Functional mobility training;Therapeutic activities;Therapeutic exercise  PT Recommendation  Follow Up Recommendations Supervision/Assistance - 24 hour;Home health PT  Equipment Recommended (has all recommended DME)  Individuals Consulted  Consulted and Agree with Results and Recommendations Patient  Acute Rehab PT Goals  PT Goal Formulation With patient  Time For Goal Achievement 7 days  Pt will go Supine/Side to Sit with modified independence  PT Goal: Supine/Side to Sit - Progress Goal set today  Pt will go Sit to Stand with modified independence (up to RW)  PT Goal: Sit to Stand - Progress Goal set today  Pt will Ambulate >150 feet;with modified independence;with rolling walker  PT Goal: Ambulate - Progress Goal set today  Pt will Go Up / Down Stairs 1-2 stairs;with min assist;with rolling walker (backwards)  PT Goal: Up/Down Stairs - Progress Goal set today  Pt will Perform Home Exercise Program Independently  PT Goal: Perform Home Exercise Program - Progress Goal set today    Pain: 8/10 L knee pain  Lewis Shock, PT, DPT Pager #: [phone] Office #: [phone]

## 2012-02-02 NOTE — Progress Notes (Signed)
Physical Therapy Treatment Note   02/02/12 1201  PT Visit Information  Last PT Received On 02/02/12  Precautions  Precautions Knee  Required Braces or Orthoses Yes  Knee Immobilizer On when out of bed or walking  Restrictions  LLE Weight Bearing WBAT  Bed Mobility  Bed Mobility (pt received sitting on commode)  Transfers  Sit to Stand 4: Min assist;From toilet  Ambulation/Gait  Ambulation/Gait Yes  Ambulation/Gait Assistance 4: Min assist  Ambulation/Gait Assistance Details (indicate cue type and reason) verbal cues for appropriate sequencing  Ambulation Distance (Feet) 80 Feet  Assistive device Rolling walker  Gait Pattern Step-to pattern;Decreased step length - left;Decreased stance time - left  Gait velocity decreased  Stairs No  Wheelchair Mobility  Wheelchair Mobility No  Posture/Postural Control  Posture/Postural Control No significant limitations  Balance  Balance Assessed No  PT - End of Session  Equipment Utilized During Treatment Gait belt;Left knee immobilizer  Activity Tolerance Patient tolerated treatment well  Patient left in chair;with call bell in reach;with family/visitor present  Nurse Communication Mobility status for ambulation  General  Behavior During Session [facility] for tasks performed  Cognition [facility] for tasks performed  PT - Assessment/Plan  PT Frequency 7X/week  Follow Up Recommendations Supervision/Assistance - 24 hour  Equipment Recommended None recommended by PT  Acute Rehab PT Goals  PT Goal: Supine/Side to Sit - Progress Progressing toward goal  PT Goal: Sit to Stand - Progress Progressing toward goal  PT Goal: Ambulate - Progress Progressing toward goal  PT Goal: Up/Down Stairs - Progress Progressing toward goal  PT Goal: Perform Home Exercise Program - Progress Progressing toward goal    Pain: 5/10 L knee pain.  Lewis Shock, PT, DPT Pager #: [phone] Office #: [phone]

## 2012-02-02 NOTE — Op Note (Signed)
NAMEFLORIE, Chelsea Finley               ACCOUNT NO.:  [id number]  MEDICAL RECORD NO.:  [id number]  LOCATION:  5001                         FACILITY:  [facility]  PHYSICIAN:  Vanita Panda. Magnus Ivan, M.D.DATE OF BIRTH:  07-03-1941  DATE OF PROCEDURE:  02/01/2012 DATE OF DISCHARGE:                              OPERATIVE REPORT   PREOPERATIVE DIAGNOSIS:  Severe osteoarthritis and degenerative joint disease, left knee.  POSTOPERATIVE DIAGNOSIS:  Severe osteoarthritis and degenerative joint disease, left knee.  PROCEDURE:  Left total knee arthroplasty utilizing computer navigation as assistance.  IMPLANTS:  DePuy rotating platform knee with size 3 femur, size 3 tibia, 15-mm polyethylene insert, 32-mm patellar button.  SURGEON:  Vanita Panda. Magnus Ivan, M.D.  ASSISTANT:  Maud Deed, [facility] who was present and needed throughout the entire case.  ANESTHESIA: 1. Left leg femoral nerve block. 2. General.  BLOOD LOSS:  Less than 200 cc.  TOURNIQUET TIME:  Less than 2 hours.  COMPLICATIONS:  None.  INDICATIONS:  Chelsea Finley is a very pleasant 71 year old female with bilateral knee osteoarthritis.  She had a successful right total knee arthroplasty just a few months ago and now she wished to proceed with a left total knee arthroplasty given the pain she is having, the impact on her quality of life, and the effect on her activities of daily living. She understands fully the risks and benefits of surgery in detail and does wish to proceed with surgery.  PROCEDURE DESCRIPTION:  After informed consent was obtained, appropriate left leg was marked.  Anesthesia obtained through femoral nerve block. She was then brought to the operating room, placed supine on the operating table.  General anesthesia was then obtained.  A nonsterile tourniquet was placed around her upper left thigh and her left leg was prepped and draped with DuraPrep and sterile drapes including a sterile stockinette.  A  time-out was called and she was identified as correct patient, correct left knee.  I then used an Esmarch, wrapped out the leg, and the tourniquet was inflated to 300 mmHg.  I then made a midline incision directly over the patella and dissected down to the knee joint. I then performed a medial parapatellar arthrotomy to open up the knee joint.  There was significant synovitis throughout the knee and I removed synovium.  I then cleaned the knee of osteophytes in all 3 compartments as well as removing remnants of the ACL, PCL, medial and lateral meniscus.  I then began the computer navigation portion of the case.  Two Steinmann pins were inserted temporarily in the tibia through 2 small stab incisions and in the main incision, 2 Steinmann pins were placed in the femur in the metaphyseal region.  Small globes were placed over this and then we began the computer mapping of the knee getting the hip center of rotation followed by selecting points from the ankle and throughout the knee to create a computer generated model of the knee. Based on this computer generator model, we made our tibia cut 10-mm off the high side.  We then made our distal femoral cut between 11 and 12 mm.  This was according to our computer plan as well.  We next used  an extension block with size 12.5 and felt like we had her in good alignment.  We next turned to a femoral rotation guide and chose the size 3 femur.  We got our femoral cutting guide on it, did our anterior and posterior cuts followed by our chamfer cuts.  Attention was then turned back to the tibia.  We drilled for a size 3 tibia after visualizing this.  We then punched the keel as well.  We then put the tibia tray trial component followed by the size 3 femur, and we trialed a 15-mm polyethylene insert.  This gave her a stable alignment and good flexion-extension with stable varus and valgus position as well.  We then cut the patella with a solid drill for  patella size 32 patellar button.  I then removed all trial instrumentation as well as all Steinmann pins.  I copiously irrigated the knee with normal saline solution using pulsatile lavage.  We then cemented the real size 3 tibia tray followed by the real size 3 femur.  We placed the real 15 polyethylene insert and cemented the real patellar button.  Once the cement had dried and hardened, the tourniquet was let down.  Hemostasis was obtained with electrocautery.  We placed a medium Hemovac drain in the arthrotomy and closed the arthrotomy with interrupted #1 Vicryl followed by 0 Vicryl in the deep tissue, 2-0 Vicryl subcutaneous tissue, 4-0 Vicryl subcuticular, and Steri-Strips.  Well-padded sterile dressing was applied.  She was awakened, extubated, and taken to the recovery room in stable condition.  All final counts were correct.  There were no other complications noted.  Of note, Maud Deed, [facility] was present and an assistant was greatly needed throughout the entire case.     Vanita Panda. Magnus Ivan, M.D.     CYB/MEDQ  D:  02/01/2012  T:  02/02/2012  Job:  [id number]

## 2012-02-02 NOTE — Progress Notes (Signed)
Subjective: 1 Day Post-Op Procedure(s) (LRB): COMPUTER ASSISTED TOTAL KNEE ARTHROPLASTY (Left) Patient reports pain as moderate.    Objective: Vital signs in last 24 hours: Temp:  [97.4 F (36.3 C)-100.2 F (37.9 C)] 100.2 F (37.9 C) (02/27 0641) Pulse Rate:  [59-98] 96  (02/27 0641) Resp:  [9-18] 18  (02/27 0722) BP: (120-173)/(65-92) 120/68 mmHg (02/27 0641) SpO2:  [95 %-100 %] 95 % (02/27 0722)  Intake/Output from previous day: 02/26 0701 - 02/27 0700 In: 2175 [I.V.:2075; IV Piggyback:100] Out: 1300 [Urine:850; Drains:400; Blood:50] Intake/Output this shift:     Basename 02/02/12 0515  HGB 9.9*    Basename 02/02/12 0515  WBC 6.8  RBC 3.51*  HCT 30.9*  PLT 303    Basename 02/02/12 0515  NA 139  K 3.7  CL 104  CO2 28  BUN 12  CREATININE 0.68  GLUCOSE 121*  CALCIUM 8.2*   No results found for this basename: LABPT:2,INR:2 in the last 72 hours  Neurovascular intact Sensation intact distally Intact pulses distally Dorsiflexion/Plantar flexion intact Incision: dressing C/D/I Compartment soft  Assessment/Plan: 1 Day Post-Op Procedure(s) (LRB): COMPUTER ASSISTED TOTAL KNEE ARTHROPLASTY (Left) Up with therapy D/C hemovac  Heath Badon Y 02/02/2012, 7:46 AM

## 2012-02-02 NOTE — Evaluation (Signed)
Occupational Therapy Evaluation Patient Details Name: Chelsea Finley MRN: [id number] DOB: 1941-08-01 Today's Date: 02/02/2012  Problem List:  Patient Active Problem List  Diagnoses  . Arthritis of knee, right  . Arthritis of knee, left    Past Medical History:  Past Medical History  Diagnosis Date  . Arthritis     OSTEO ARTHRITIS   Past Surgical History:  Past Surgical History  Procedure Date  . Rotator cuff repair 05/2010  . Knee arthroplasty 11/16/2011    Procedure: COMPUTER ASSISTED TOTAL KNEE ARTHROPLASTY;  Surgeon: Kathryne Hitch;  Location: [facility] OR;  Service: Orthopedics;  Laterality: Right;  Right total knee arthroplasty  . Appendectomy   . Tubal ligation     OT Assessment/Plan/Recommendation OT Assessment Clinical Impression Statement: Pt. presents s/p L TKA and with increased pain. All education complete and no further OT needs noted. Will sign off acutely and recommend D/C home with family assist PRN. OT Recommendation/Assessment: Patient does not need any further OT services OT Recommendation Follow Up Recommendations: No OT follow up Equipment Recommended: None recommended by OT  OT Evaluation Precautions/Restrictions  Precautions Precautions: Knee Required Braces or Orthoses: Yes Knee Immobilizer: On when out of bed or walking Restrictions Weight Bearing Restrictions: Yes LLE Weight Bearing: Weight bearing as tolerated Prior Functioning Home Living Lives With: Spouse Receives Help From: Family Type of Home: House Home Layout: One level Home Access: Stairs to enter Entrance Stairs-Rails: None Secretary/administrator of Steps: 2 Bathroom Shower/Tub: Health visitor: Standard Bathroom Accessibility: Yes Prior Function Level of Independence: Independent with basic ADLs;Independent with homemaking with ambulation;Independent with transfers;Independent with gait Able to Take Stairs?: Yes Driving: Yes ADL ADL Eating/Feeding:  Performed;Independent Where Assessed - Eating/Feeding: Chair Grooming: Performed;Wash/dry hands;Set up;Minimal assistance Grooming Details (indicate cue type and reason): Min verbal cues for safety with RW use standing at sink Where Assessed - Grooming: Standing at sink Upper Body Bathing: Simulated;Chest;Right arm;Left arm;Abdomen;Set up Where Assessed - Upper Body Bathing: Sitting, chair Lower Body Bathing: Simulated;Minimal assistance Lower Body Bathing Details (indicate cue type and reason): Educated pt. on using long handled sponge to complete Where Assessed - Lower Body Bathing: Sitting, chair Upper Body Dressing: Performed;Set up Upper Body Dressing Details (indicate cue type and reason): with donning gown Where Assessed - Upper Body Dressing: Sitting, chair Lower Body Dressing: Simulated;Moderate assistance Lower Body Dressing Details (indicate cue type and reason): educated pt. on use of reacher and sock aid to increase independence, pt. reports she plans to have husband and grandkids complete her LB dressing Where Assessed - Lower Body Dressing: Sit to stand from chair Toilet Transfer: Performed;Minimal assistance Toilet Transfer Details (indicate cue type and reason): Mod verbal cues for hand placement and technique with safety with use of RW Toilet Transfer Method: Ambulating Toilet Transfer Equipment: Raised toilet seat with arms (or 3-in-1 over toilet) Toileting - Clothing Manipulation: Performed;Set up Where Assessed - Toileting Clothing Manipulation: Sit on 3-in-1 or toilet Toileting - Hygiene: Performed;Set up Where Assessed - Toileting Hygiene: Sit on 3-in-1 or toilet Ambulation Related to ADLs: Pt. min assist for ~45' with RW and min verbal cues for safety  ADL Comments: Pt. educated on use of AE to complete LB ADLs and technique for shower transfer. Pt. able to verbalize technique from previous knee replacement and reports ability to complete it fine.    Extremity  Assessment RUE Assessment RUE Assessment: Within Functional Limits LUE Assessment LUE Assessment: Within Functional Limits Mobility  Transfers Transfers: Yes Sit to Stand:  4: Min assist;From bed;With upper extremity assist Sit to Stand Details (indicate cue type and reason): Min verbal cues for hand placement    End of Session OT - End of Session Equipment Utilized During Treatment: Gait belt;Left knee immobilizer Activity Tolerance: Patient tolerated treatment well Patient left: in chair;with call bell in reach Nurse Communication: Mobility status for transfers General Behavior During Session: [facility] for tasks performed Cognition: [facility] for tasks performed   Paradise Vensel, OTR/L Pager [phone] 02/02/2012, 1:36 PM

## 2012-02-03 ENCOUNTER — Encounter (HOSPITAL_COMMUNITY): Payer: Self-pay | Admitting: Orthopaedic Surgery

## 2012-02-03 LAB — CBC
HCT: 26.8 % — ABNORMAL LOW (ref 36.0–46.0)
MCHC: 32.5 g/dL (ref 30.0–36.0)
MCV: 88.2 fL (ref 78.0–100.0)
Platelets: 239 10*3/uL (ref 150–400)
RDW: 13.9 % (ref 11.5–15.5)
WBC: 4.1 10*3/uL (ref 4.0–10.5)

## 2012-02-03 MED ORDER — METHOCARBAMOL 500 MG PO TABS: 500.0000 mg | ORAL_TABLET | Freq: Four times a day (QID) | ORAL | Status: AC | PRN

## 2012-02-03 MED ORDER — RIVAROXABAN 10 MG PO TABS: 10.0000 mg | ORAL_TABLET | Freq: Every day | ORAL | Status: DC

## 2012-02-03 MED ORDER — OXYCODONE-ACETAMINOPHEN 5-325 MG PO TABS: 1.0000 | ORAL_TABLET | ORAL | Status: AC | PRN

## 2012-02-03 NOTE — Progress Notes (Signed)
Physical Therapy Treatment Note   02/03/12 1047  PT Visit Information  Last PT Received On 02/03/12  Precautions  Precautions Knee  Required Braces or Orthoses No  Restrictions  LLE Weight Bearing WBAT  Bed Mobility  Supine to Sit 5: Supervision  Supine to Sit Details (indicate cue type and reason) increased time, pt used UEs to assist with L LE management off bed  Transfers  Sit to Stand 4: Min assist  Sit to Stand Details (indicate cue type and reason) contact guard, verbal cues for hand placement  Ambulation/Gait  Ambulation/Gait Assistance 4: Min assist (contact guard)  Ambulation/Gait Assistance Details (indicate cue type and reason) verbal cues for sequencing  Ambulation Distance (Feet) 150 Feet  Assistive device Rolling walker  Gait Pattern Step-through pattern;Decreased stride length  Gait velocity improved from yesterday  Stairs Yes  Stairs Assistance 4: Min assist (for walker management)  Stairs Assistance Details (indicate cue type and reason) daughter present to assist for when pt goes home later today  Stair Management Technique No rails;Backwards;With walker  Number of Stairs 2   Height of Stairs 8   Wheelchair Mobility  Wheelchair Mobility No  Posture/Postural Control  Posture/Postural Control No significant limitations  Balance  Balance Assessed No  Total Joint Exercises  Ankle Circles/Pumps AROM;10 reps;Both  Quad Sets AROM;10 reps;Supine  Heel Slides AROM;Left;20 reps;Seated (with towel beneath foot)  PT - End of Session  Equipment Utilized During Treatment Gait belt  Activity Tolerance Patient tolerated treatment well  Patient left (patient left with son and dtr in another patients room secondary to close friend that patient wanted to visit. Pt safe to ambulate with family back to room)  Nurse Communication Mobility status for ambulation  General  Behavior During Session [facility] for tasks performed  Cognition [facility] for tasks performed  PT -  Assessment/Plan  Comments on Treatment Session Patient progressing well towards all goals and reports pain to be under control today allowing patient to improve ambulation endurance and complete stair negotiation. Patient safe to return home with spouse and recommended DME in addition to [facility] PT.  PT Plan Discharge plan remains appropriate;Frequency remains appropriate  PT Frequency 7X/week  Follow Up Recommendations Home health PT  Equipment Recommended None recommended by PT  Acute Rehab PT Goals  PT Goal: Supine/Side to Sit - Progress Progressing toward goal  PT Goal: Sit to Stand - Progress Progressing toward goal  PT Goal: Ambulate - Progress Progressing toward goal  PT Goal: Up/Down Stairs - Progress Progressing toward goal  PT Goal: Perform Home Exercise Program - Progress Progressing toward goal    Pain: 2/10 L knee pain  Lewis Shock, PT, DPT Pager #: [phone] Office #: [phone]

## 2012-02-03 NOTE — Discharge Instructions (Signed)
You can get your current dressing wet in the shower. You can get your actual incision wet starting this Sunday 02/06/12. Dry dressing daily as needed.

## 2012-02-03 NOTE — Discharge Summary (Signed)
Patient ID: [name] MRN: [id number] DOB/AGE: 1940/12/12 71 y.o.  Admit date: 02/01/2012 Discharge date: 02/03/2012  Admission Diagnoses:  Principal Problem:  *Arthritis of knee, left   Discharge Diagnoses:  Same  Past Medical History  Diagnosis Date  . Arthritis     OSTEO ARTHRITIS    Surgeries: Procedure(s): COMPUTER ASSISTED TOTAL KNEE ARTHROPLASTY on 02/01/2012   Consultants:    Discharged Condition: Improved  Hospital Course: Chelsea Finley is an 71 y.o. female who was admitted 02/01/2012 for operative treatment ofArthritis of knee, left. Patient has severe unremitting pain that affects sleep, daily activities, and work/hobbies. After pre-op clearance the patient was taken to the operating room on 02/01/2012 and underwent  Procedure(s): COMPUTER ASSISTED TOTAL KNEE ARTHROPLASTY.    Patient was given perioperative antibiotics: Anti-infectives     Start     Dose/Rate Route Frequency Ordered Stop   02/01/12 2000   ceFAZolin (ANCEF) IVPB 1 g/50 mL premix        1 g 100 mL/hr over 30 Minutes Intravenous Every 6 hours 02/01/12 1734 02/20/2012 0749   01/31/12 1515   ceFAZolin (ANCEF) IVPB 2 g/50 mL premix        2 g 100 mL/hr over 30 Minutes Intravenous 60 min pre-op 01/31/12 1506 02/01/12 1245           Patient was given sequential compression devices, early ambulation, and chemoprophylaxis to prevent DVT.  Patient benefited maximally from hospital stay and there were no complications.    Recent vital signs: Patient Vitals for the past 24 hrs:  BP Temp Temp src Pulse Resp SpO2  02/03/12 0619 118/73 mmHg 99.2 F (37.3 C) - 97  19  98 %  20-Feb-2012 2209 133/73 mmHg 100.4 F (38 C) - 98  19  92 %  Feb 20, 2012 2144 - - - - 18  96 %  February 20, 2012 1300 119/51 mmHg 99.9 F (37.7 C) - 88  16  94 %  02/20/12 0815 - 99.8 F (37.7 C) - - - 96 %  02-20-2012 0722 - - - - 18  95 %  2012/02/20 0641 120/68 mmHg 100.2 F (37.9 C) Axillary 96  18  96 %     Recent laboratory  studies:  Basename 02/03/12 0452 2012-02-20 0515  WBC 4.1 6.8  HGB 8.7* 9.9*  HCT 26.8* 30.9*  PLT 239 303  NA -- 139  K -- 3.7  CL -- 104  CO2 -- 28  BUN -- 12  CREATININE -- 0.68  GLUCOSE -- 121*  INR -- --  CALCIUM -- 8.2*     Discharge Medications:   Medication List  As of 02/03/2012  6:30 AM   TAKE these medications         methocarbamol 500 MG tablet   Commonly known as: ROBAXIN   Take 1 tablet (500 mg total) by mouth every 6 (six) hours as needed.      oxyCODONE-acetaminophen 5-325 MG per tablet   Commonly known as: PERCOCET   Take 1-2 tablets by mouth every 4 (four) hours as needed for pain.      rivaroxaban 10 MG Tabs tablet   Commonly known as: XARELTO   Take 1 tablet (10 mg total) by mouth daily with breakfast.            Diagnostic Studies: X-ray Knee Left Port  02/01/2012  *RADIOLOGY REPORT*  Clinical Data: Postop arthroplasty of the left knee  PORTABLE LEFT KNEE - 1-2 VIEW  Comparison: The right  knee arthroplasty 11/16/2011  Findings: There are changes of left knee total arthroplasty. Expected soft tissue gas and swelling is seen.  Soft tissue drain and skin staples are noted.  No hardware complication or fracture.  IMPRESSION: Expected immediate postoperative changes of left total knee arthroplasty.  No unexpected findings.  Original Report Authenticated By: Britta Mccreedy, M.D.    Disposition: Home-Health Care Svc       Signed: Kathryne Hitch 02/03/2012, 6:30 AM

## 2014-10-11 ENCOUNTER — Emergency Department (HOSPITAL_COMMUNITY)
Admission: EM | Admit: 2014-10-11 | Discharge: 2014-10-11 | Disposition: A | Discharge status: Home / Self Care | Payer: Medicare HMO | Attending: Emergency Medicine | Admitting: Emergency Medicine

## 2014-10-11 ENCOUNTER — Encounter (HOSPITAL_COMMUNITY): Payer: Self-pay

## 2014-10-11 ENCOUNTER — Emergency Department (HOSPITAL_COMMUNITY): Payer: Medicare HMO

## 2014-10-11 DIAGNOSIS — M1990 Unspecified osteoarthritis, unspecified site: Secondary | ICD-10-CM | POA: Diagnosis not present

## 2014-10-11 DIAGNOSIS — H538 Other visual disturbances: Secondary | ICD-10-CM | POA: Diagnosis not present

## 2014-10-11 DIAGNOSIS — H539 Unspecified visual disturbance: Secondary | ICD-10-CM

## 2014-10-11 DIAGNOSIS — H34831 Tributary (branch) retinal vein occlusion, right eye: Secondary | ICD-10-CM | POA: Diagnosis not present

## 2014-10-11 DIAGNOSIS — I10 Essential (primary) hypertension: Secondary | ICD-10-CM

## 2014-10-11 DIAGNOSIS — Z79899 Other long term (current) drug therapy: Secondary | ICD-10-CM | POA: Insufficient documentation

## 2014-10-11 LAB — CBC WITH DIFFERENTIAL/PLATELET
Basophils Absolute: 0 10*3/uL (ref 0.0–0.1)
Basophils Relative: 0 % (ref 0–1)
EOS PCT: 1 % (ref 0–5)
Eosinophils Absolute: 0 10*3/uL (ref 0.0–0.7)
HEMATOCRIT: 43.5 % (ref 36.0–46.0)
HEMOGLOBIN: 14.8 g/dL (ref 12.0–15.0)
LYMPHS ABS: 1.6 10*3/uL (ref 0.7–4.0)
LYMPHS PCT: 41 % (ref 12–46)
MCH: 28.9 pg (ref 26.0–34.0)
MCHC: 34 g/dL (ref 30.0–36.0)
MCV: 85 fL (ref 78.0–100.0)
MONO ABS: 0.6 10*3/uL (ref 0.1–1.0)
MONOS PCT: 15 % — AB (ref 3–12)
Neutro Abs: 1.7 10*3/uL (ref 1.7–7.7)
Neutrophils Relative %: 43 % (ref 43–77)
Platelets: 291 10*3/uL (ref 150–400)
RBC: 5.12 MIL/uL — AB (ref 3.87–5.11)
RDW: 13.5 % (ref 11.5–15.5)
WBC: 3.9 10*3/uL — AB (ref 4.0–10.5)

## 2014-10-11 LAB — COMPREHENSIVE METABOLIC PANEL
ALT: 22 U/L (ref 0–35)
AST: 29 U/L (ref 0–37)
Albumin: 4 g/dL (ref 3.5–5.2)
Alkaline Phosphatase: 75 U/L (ref 39–117)
Anion gap: 11 (ref 5–15)
BILIRUBIN TOTAL: 0.3 mg/dL (ref 0.3–1.2)
BUN: 14 mg/dL (ref 6–23)
CALCIUM: 9.2 mg/dL (ref 8.4–10.5)
CO2: 27 meq/L (ref 19–32)
CREATININE: 0.79 mg/dL (ref 0.50–1.10)
Chloride: 104 mEq/L (ref 96–112)
GFR, EST NON AFRICAN AMERICAN: 80 mL/min — AB (ref >90)
GLUCOSE: 103 mg/dL — AB (ref 70–99)
Potassium: 4.6 mEq/L (ref 3.7–5.3)
Sodium: 142 mEq/L (ref 137–147)
Total Protein: 7.8 g/dL (ref 6.0–8.3)

## 2014-10-11 LAB — HOMOCYSTEINE: Homocysteine: 9.9 umol/L (ref 4.0–15.4)

## 2014-10-11 LAB — TROPONIN I

## 2014-10-11 LAB — PROTIME-INR
INR: 1.09 (ref 0.00–1.49)
PROTHROMBIN TIME: 14.2 s (ref 11.6–15.2)

## 2014-10-11 MED ORDER — HYDROCHLOROTHIAZIDE 25 MG PO TABS
25.0000 mg | ORAL_TABLET | Freq: Every day | ORAL | Status: DC
  Administered 2014-10-11: 25 mg via ORAL
  Filled 2014-10-11 (×3): qty 1

## 2014-10-11 MED ORDER — HYDROCHLOROTHIAZIDE 25 MG PO TABS: 25.0000 mg | ORAL_TABLET | Freq: Every day | ORAL | Status: DC

## 2014-10-11 NOTE — ED Notes (Signed)
Phoned pharmacy for HCTZ order.

## 2014-10-11 NOTE — ED Provider Notes (Signed)
CSN: [id number]     Arrival date & time 10/11/14  1433 History   First MD Initiated Contact with Patient 10/11/14 1457    This chart was scribed for No att. providers found by Marica OtterNusrat Rahman, ED Scribe. This patient was seen in room APA18/APA18 and the patient's care was started at 2:58 PM.  Chief Complaint  Patient presents with  . Hypertension   HPI Pcp: Josue HectorNYLAND,LEONARD ROBERT, MD  [facility] HPI Comments: Liana GeroldMarie Y Fedele is a 73 y.o. female who presents to the Emergency Department complaining of blood behind her right eye onset prior to arrival to the ED. Pt reports that her eyes were dilated during her eye exam. Pt also complains of assocaited blurred vision and black spots onset one month ago. Pt reports she took BP meds number of years ago. Pt denies HA, dizziness, fever, chills, lightheadedness, chest pain, SOB, use of blood thinners, or abd pain.   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  Past Medical History  Diagnosis Date  . Arthritis     OSTEO ARTHRITIS   Past Surgical History  Procedure Laterality Date  . Rotator cuff repair  05/2010  . Knee arthroplasty  11/16/2011    Procedure: COMPUTER ASSISTED TOTAL KNEE ARTHROPLASTY;  Surgeon: Kathryne Hitchhristopher Y Blackman;  Location: [facility] OR;  Service: Orthopedics;  Laterality: Right;  Right total knee arthroplasty  . Appendectomy    . Tubal ligation    . Knee arthroplasty  02/01/2012    Procedure: COMPUTER ASSISTED TOTAL KNEE ARTHROPLASTY;  Surgeon: Kathryne Hitchhristopher Y Blackman, MD;  [facility];  Service: Orthopedics;  Laterality: Left;  Left total knee arthroplasty   No family history on file. History  Substance Use Topics  . Smoking status: Never Smoker   . Smokeless tobacco: Not on file  . Alcohol Use: No   OB History    No data  available     Review of Systems  Constitutional: Negative for fever and chills.  Eyes: Positive for visual disturbance (blurred vision and black spots).  Respiratory: Negative for shortness of breath.   Cardiovascular: Negative for chest pain.  Gastrointestinal: Negative for abdominal pain.  Neurological: Negative for dizziness, light-headedness and headaches.  Hematological: Does not bruise/bleed easily.  All other systems reviewed and are negative.     Allergies  Morphine and related  Home Medications   Prior to Admission medications   Medication Sig Start Date End Date Taking? Authorizing Provider  naproxen sodium (ALEVE) 220 MG tablet Take 220 mg by mouth daily as needed (for shoulder pain).   Yes Historical Provider, MD  hydrochlorothiazide (HYDRODIURIL) 25 MG tablet Take 1 tablet (25 mg total) by mouth daily. 10/11/14   Glynn OctaveStephen Krystall Kruckenberg, MD  rivaroxaban (XARELTO) 10 MG TABS tablet Take 1 tablet (10 mg total) by mouth daily with breakfast. Patient not taking: Reported on 10/11/2014 02/03/12   Kathryne Hitchhristopher Y Blackman, MD   Triage Vitals: BP 166/105 mmHg  Pulse 85  Temp(Src) 97.9 F (36.6 C) (Oral)  Resp 18  Ht 5\' 2"  (1.575 m)  Wt 182 lb (82.555 kg)  BMI 33.28 kg/m2  SpO2 98% Physical Exam  Constitutional: She is oriented to person, place, and time. She appears well-developed and well-nourished. No distress.  HENT:  Head: Normocephalic and atraumatic.  Mouth/Throat: Oropharynx is clear and moist. No oropharyngeal  exudate.  Eyes: Conjunctivae and EOM are normal. Pupils are equal, round, and reactive to light.  Pupils dilated pharmalogically. Photophobic.  No appreciable papilledema  Neck: Normal range of motion. Neck supple.  No meningismus.  Cardiovascular: Normal rate, regular rhythm, normal heart sounds and intact distal pulses.   No murmur heard. Pulmonary/Chest: Effort normal and breath sounds normal. No respiratory distress.  Abdominal: Soft. There is no  tenderness. There is no rebound and no guarding.  Musculoskeletal: Normal range of motion. She exhibits no edema or tenderness.  Neurological: She is alert and oriented to person, place, and time. No cranial nerve deficit. She exhibits normal muscle tone. Coordination normal.  No ataxia on finger to nose bilaterally. No pronator drift. 5/5 strength throughout. CN 2-12 intact. Negative Romberg. Equal grip strength. Sensation intact. Gait is normal.   Skin: Skin is warm.  Psychiatric: She has a normal mood and affect. Her behavior is normal.  Nursing note and vitals reviewed.   ED Course  Procedures (including critical care time) DIAGNOSTIC STUDIES: Oxygen Saturation is 98% on RA, nl by my interpretation.    COORDINATION OF CARE: 3:04 PM-Discussed treatment plan which includes labs and imaging with pt at bedside and pt agreed to plan.   Labs Review Labs Reviewed  CBC WITH DIFFERENTIAL - Abnormal; Notable for the following:    WBC 3.9 (*)    RBC 5.12 (*)    Monocytes Relative 15 (*)    All other components within normal limits  COMPREHENSIVE METABOLIC PANEL - Abnormal; Notable for the following:    Glucose, Bld 103 (*)    GFR calc non Af Amer 80 (*)    All other components within normal limits  TROPONIN I  PROTIME-INR  HOMOCYSTEINE  ANTITHROMBIN III  PROTEIN C ACTIVITY  PROTEIN C, TOTAL  PROTEIN S ACTIVITY  PROTEIN S, TOTAL  LUPUS ANTICOAGULANT PANEL  BETA-2-GLYCOPROTEIN I ABS, IGG/M/A  FACTOR 5 LEIDEN  PROTHROMBIN GENE MUTATION  CARDIOLIPIN ANTIBODIES, IGG, IGM, IGA    Imaging Review Mr Brain Wo Contrast  10/11/2014   CLINICAL DATA:  Blurred vision in the right eye.  EXAM: MRI HEAD WITHOUT CONTRAST  TECHNIQUE: Multiplanar, multiecho pulse sequences of the brain and surrounding structures were obtained without intravenous contrast.  COMPARISON:  The diffusion-weighted images demonstrate no evidence for acute or subacute infarction. No hemorrhage or mass lesion is present.   FINDINGS: No acute infarct, hemorrhage, or mass lesion is present. The ventricles are of normal size. No significant extraaxial fluid collection is present. Minimal white matter changes bilaterally are within normal limits for age. There is a remote lacunar infarct of the right thalamus. The basal ganglia are otherwise intact.  Flow is present in the major intracranial arteries. The globes orbits are intact. The paranasal sinuses and mastoid air cells are clear.  IMPRESSION: 1. Remote lacunar infarct of the right thalamus. 2. Otherwise normal MRI of the brain.   Electronically Signed   By: Gennette Pac M.D.   On: 10/11/2014 16:44     EKG Interpretation   Date/Time:  Friday October 11 2014 14:45:57 EST Ventricular Rate:  79 PR Interval:  166 QRS Duration: 90 QT Interval:  392 QTC Calculation: 449 R Axis:   74 Text Interpretation:  Sinus rhythm Low voltage, precordial leads Abnormal  inferior Q waves Probable anteroseptal infarct, old Baseline wander in  lead(s) II III aVR aVF No significant change was found Confirmed by  Manus Gunning  MD, Azlyn Wingler [phone]) on 10/11/2014 3:35:18 PM  MDM   Final diagnoses:  Vision changes  Essential hypertension  Retinal vein occlusion, branch, right   Sent from optometrist Dr. Conley RollsLe with right eye visual changes for the past 2 months. No pain or headache. No dizziness. No weakness, numbness or tingling. BP elevated.  Does not take BP meds or other meds.  Discussed with Dr. Conley RollsLe, patient has hemi-retinal branch and retinal vein occlusion in the right eye. Best acuity 20/80. Dr. Nedra HaiLee wishes assessment for diabetes, hypertension, carotid stenosis, disease and hyperviscosity syndrome. Patient has retinal follow-up in 2 weeks.  MRI negative for acute infarcts. Old lacunar infarct. Creatinine normal. No evidence of end organ damage. EKG normal sinus rhythm.  Patient will be started on hydrochlorothiazide for her blood pressure which she has been on in the past.  Instructed to keep a record of her blood pressure and follow up with her PCP. Dr. Lita MainsHaines agrees that her retinal branch vein occlusion does not require any intervention tonight. No change in eye symptoms for several months. Hypercoagulable workup pending. Return precautions discussed.  BP 178/92 mmHg  Pulse 78  Temp(Src) 97.9 F (36.6 C) (Oral)  Resp 16  Ht 5\' 2"  (1.575 m)  Wt 182 lb (82.555 kg)  BMI 33.28 kg/m2  SpO2 95%   I personally performed the services described in this documentation, which was scribed in my presence.  The recorded information has been reviewed and considered.   Glynn OctaveStephen Norvil Martensen, MD 10/12/14 [phone]

## 2014-10-11 NOTE — Discharge Instructions (Signed)
Hypertension Follow-up with Dr. Lysbeth GalasNyland regarding her blood pressure. Follow up with the retinal specialist as instructed. Return to the ED if you developed worsening vision, headache, chest pain, shortness of breath or any other concerns. Hypertension, commonly called high blood pressure, is when the force of blood pumping through your arteries is too strong. Your arteries are the blood vessels that carry blood from your heart throughout your body. A blood pressure reading consists of a higher number over a lower number, such as 110/72. The higher number (systolic) is the pressure inside your arteries when your heart pumps. The lower number (diastolic) is the pressure inside your arteries when your heart relaxes. Ideally you want your blood pressure below 120/80. Hypertension forces your heart to work harder to pump blood. Your arteries may become narrow or stiff. Having hypertension puts you at risk for heart disease, stroke, and other problems.  RISK FACTORS Some risk factors for high blood pressure are controllable. Others are not.  Risk factors you cannot control include:   Race. You may be at higher risk if you are African American.  Age. Risk increases with age.  Gender. Men are at higher risk than women before age 645 years. After age 74, women are at higher risk than men. Risk factors you can control include:  Not getting enough exercise or physical activity.  Being overweight.  Getting too much fat, sugar, calories, or salt in your diet.  Drinking too much alcohol. SIGNS AND SYMPTOMS Hypertension does not usually cause signs or symptoms. Extremely high blood pressure (hypertensive crisis) may cause headache, anxiety, shortness of breath, and nosebleed. DIAGNOSIS  To check if you have hypertension, your health care provider will measure your blood pressure while you are seated, with your arm held at the level of your heart. It should be measured at least twice using the same arm.  Certain conditions can cause a difference in blood pressure between your right and left arms. A blood pressure reading that is higher than normal on one occasion does not mean that you need treatment. If one blood pressure reading is high, ask your health care provider about having it checked again. TREATMENT  Treating high blood pressure includes making lifestyle changes and possibly taking medicine. Living a healthy lifestyle can help lower high blood pressure. You may need to change some of your habits. Lifestyle changes may include:  Following the DASH diet. This diet is high in fruits, vegetables, and whole grains. It is low in salt, red meat, and added sugars.  Getting at least 2 hours of brisk physical activity every week.  Losing weight if necessary.  Not smoking.  Limiting alcoholic beverages.  Learning ways to reduce stress. If lifestyle changes are not enough to get your blood pressure under control, your health care provider may prescribe medicine. You may need to take more than one. Work closely with your health care provider to understand the risks and benefits. HOME CARE INSTRUCTIONS  Have your blood pressure rechecked as directed by your health care provider.   Take medicines only as directed by your health care provider. Follow the directions carefully. Blood pressure medicines must be taken as prescribed. The medicine does not work as well when you skip doses. Skipping doses also puts you at risk for problems.   Do not smoke.   Monitor your blood pressure at home as directed by your health care provider. SEEK MEDICAL CARE IF:   You think you are having a reaction to medicines taken.  You have recurrent headaches or feel dizzy.  You have swelling in your ankles.  You have trouble with your vision. SEEK IMMEDIATE MEDICAL CARE IF:  You develop a severe headache or confusion.  You have unusual weakness, numbness, or feel faint.  You have severe chest or  abdominal pain.  You vomit repeatedly.  You have trouble breathing. MAKE SURE YOU:   Understand these instructions.  Will watch your condition.  Will get help right away if you are not doing well or get worse. Document Released: 11/22/2005 Document Revised: 04/08/2014 Document Reviewed: 09/14/2013 [facility] Patient Information 2015 [employer] [place]. This information is not intended to replace advice given to you by your health care provider. Make sure you discuss any questions you have with your health care provider.

## 2014-10-11 NOTE — ED Notes (Signed)
Pt reports went to eye doctor today and was told she had some blood behind her r eye.  Reports has had difficulty seeing out of r eye for " a while."  Reports bp at the eye doctor's office was 200/120.  Denies any pain but says part of her visual field in r eye is blurry.

## 2014-10-11 NOTE — ED Notes (Signed)
Pt was at eye exam, upon assessment BP was high and during eye exam blood was found in lower portion of right eye.

## 2014-10-12 LAB — ANTITHROMBIN III: ANTITHROMB III FUNC: 101 % (ref 75–120)

## 2014-10-14 LAB — PROTEIN C, TOTAL: Protein C, Total: 85 % (ref 72–160)

## 2014-10-14 LAB — PROTEIN S ACTIVITY: Protein S Activity: 85 % (ref 69–129)

## 2014-10-14 LAB — PROTEIN S, TOTAL: Protein S Ag, Total: 65 % (ref 60–150)

## 2014-10-14 LAB — PROTEIN C ACTIVITY: Protein C Activity: 180 % — ABNORMAL HIGH (ref 75–133)

## 2014-10-15 LAB — LUPUS ANTICOAGULANT PANEL
DRVVT: 41.3 secs (ref <42.9)
Lupus Anticoagulant: NOT DETECTED
PTT Lupus Anticoagulant: 53.1 secs — ABNORMAL HIGH (ref 28.0–43.0)
PTTLA 41 MIX: 47.7 s — AB (ref 28.0–43.0)
PTTLA Confirmation: 1.5 secs (ref <8.0)

## 2014-10-15 LAB — CARDIOLIPIN ANTIBODIES, IGG, IGM, IGA
Anticardiolipin IgA: 10 APL U/mL — ABNORMAL LOW (ref <22)
Anticardiolipin IgG: 15 GPL U/mL (ref <23)
Anticardiolipin IgM: 4 MPL U/mL — ABNORMAL LOW (ref <11)

## 2014-10-15 LAB — BETA-2-GLYCOPROTEIN I ABS, IGG/M/A
BETA-2-GLYCOPROTEIN I IGA: 31 A Units — AB (ref <20)
Beta-2 Glyco I IgG: 4 G Units (ref <20)
Beta-2-Glycoprotein I IgM: 12 M Units (ref <20)

## 2014-10-16 ENCOUNTER — Encounter (INDEPENDENT_AMBULATORY_CARE_PROVIDER_SITE_OTHER): Payer: Medicare HMO | Admitting: Ophthalmology

## 2014-10-16 DIAGNOSIS — H35033 Hypertensive retinopathy, bilateral: Secondary | ICD-10-CM

## 2014-10-16 DIAGNOSIS — I10 Essential (primary) hypertension: Secondary | ICD-10-CM

## 2014-10-16 DIAGNOSIS — H34831 Tributary (branch) retinal vein occlusion, right eye: Secondary | ICD-10-CM

## 2014-10-16 DIAGNOSIS — H2513 Age-related nuclear cataract, bilateral: Secondary | ICD-10-CM

## 2014-10-16 DIAGNOSIS — H43813 Vitreous degeneration, bilateral: Secondary | ICD-10-CM

## 2014-10-16 LAB — PROTHROMBIN GENE MUTATION

## 2014-10-16 LAB — FACTOR 5 LEIDEN

## 2014-11-15 ENCOUNTER — Encounter (INDEPENDENT_AMBULATORY_CARE_PROVIDER_SITE_OTHER): Payer: Medicare HMO | Admitting: Ophthalmology

## 2014-11-15 DIAGNOSIS — H34831 Tributary (branch) retinal vein occlusion, right eye: Secondary | ICD-10-CM

## 2014-11-15 DIAGNOSIS — H43813 Vitreous degeneration, bilateral: Secondary | ICD-10-CM

## 2014-11-15 DIAGNOSIS — I10 Essential (primary) hypertension: Secondary | ICD-10-CM

## 2014-11-15 DIAGNOSIS — H35033 Hypertensive retinopathy, bilateral: Secondary | ICD-10-CM

## 2014-12-09 ENCOUNTER — Other Ambulatory Visit (HOSPITAL_COMMUNITY): Payer: Self-pay | Admitting: Family Medicine

## 2014-12-09 DIAGNOSIS — H348112 Central retinal vein occlusion, right eye, stable: Secondary | ICD-10-CM

## 2014-12-12 ENCOUNTER — Ambulatory Visit (HOSPITAL_COMMUNITY)
Admission: RE | Admit: 2014-12-12 | Discharge: 2014-12-12 | Disposition: A | Discharge status: Home / Self Care | Payer: Medicare PPO | Source: Ambulatory Visit | Attending: Family Medicine | Admitting: Family Medicine

## 2014-12-12 ENCOUNTER — Ambulatory Visit (HOSPITAL_COMMUNITY): Admission: RE | Admit: 2014-12-12 | Payer: Medicare HMO | Source: Ambulatory Visit

## 2014-12-12 DIAGNOSIS — H538 Other visual disturbances: Secondary | ICD-10-CM | POA: Diagnosis not present

## 2014-12-12 DIAGNOSIS — H349 Unspecified retinal vascular occlusion: Secondary | ICD-10-CM | POA: Diagnosis present

## 2014-12-12 DIAGNOSIS — I10 Essential (primary) hypertension: Secondary | ICD-10-CM | POA: Insufficient documentation

## 2014-12-12 DIAGNOSIS — H348112 Central retinal vein occlusion, right eye, stable: Secondary | ICD-10-CM

## 2014-12-12 DIAGNOSIS — I6523 Occlusion and stenosis of bilateral carotid arteries: Secondary | ICD-10-CM | POA: Diagnosis not present

## 2014-12-13 ENCOUNTER — Encounter (INDEPENDENT_AMBULATORY_CARE_PROVIDER_SITE_OTHER): Payer: Medicare HMO | Admitting: Ophthalmology

## 2014-12-13 DIAGNOSIS — H43813 Vitreous degeneration, bilateral: Secondary | ICD-10-CM

## 2014-12-13 DIAGNOSIS — H34811 Central retinal vein occlusion, right eye: Secondary | ICD-10-CM

## 2014-12-13 DIAGNOSIS — I10 Essential (primary) hypertension: Secondary | ICD-10-CM

## 2014-12-13 DIAGNOSIS — H35033 Hypertensive retinopathy, bilateral: Secondary | ICD-10-CM

## 2015-01-09 ENCOUNTER — Encounter (INDEPENDENT_AMBULATORY_CARE_PROVIDER_SITE_OTHER): Payer: Medicare HMO | Admitting: Ophthalmology

## 2015-01-09 DIAGNOSIS — H34811 Central retinal vein occlusion, right eye: Secondary | ICD-10-CM

## 2015-01-09 DIAGNOSIS — H2513 Age-related nuclear cataract, bilateral: Secondary | ICD-10-CM

## 2015-01-09 DIAGNOSIS — H43813 Vitreous degeneration, bilateral: Secondary | ICD-10-CM

## 2015-01-09 DIAGNOSIS — H35033 Hypertensive retinopathy, bilateral: Secondary | ICD-10-CM

## 2015-01-09 DIAGNOSIS — I10 Essential (primary) hypertension: Secondary | ICD-10-CM

## 2015-01-31 ENCOUNTER — Encounter (INDEPENDENT_AMBULATORY_CARE_PROVIDER_SITE_OTHER): Payer: Medicare HMO | Admitting: Ophthalmology

## 2015-01-31 DIAGNOSIS — H35033 Hypertensive retinopathy, bilateral: Secondary | ICD-10-CM

## 2015-01-31 DIAGNOSIS — H43813 Vitreous degeneration, bilateral: Secondary | ICD-10-CM

## 2015-01-31 DIAGNOSIS — I10 Essential (primary) hypertension: Secondary | ICD-10-CM

## 2015-01-31 DIAGNOSIS — H34811 Central retinal vein occlusion, right eye: Secondary | ICD-10-CM

## 2015-02-24 ENCOUNTER — Encounter (INDEPENDENT_AMBULATORY_CARE_PROVIDER_SITE_OTHER): Payer: Medicare HMO | Admitting: Ophthalmology

## 2015-02-24 DIAGNOSIS — H2513 Age-related nuclear cataract, bilateral: Secondary | ICD-10-CM | POA: Diagnosis not present

## 2015-02-24 DIAGNOSIS — I10 Essential (primary) hypertension: Secondary | ICD-10-CM

## 2015-02-24 DIAGNOSIS — H43813 Vitreous degeneration, bilateral: Secondary | ICD-10-CM

## 2015-02-24 DIAGNOSIS — H35033 Hypertensive retinopathy, bilateral: Secondary | ICD-10-CM | POA: Diagnosis not present

## 2015-02-24 DIAGNOSIS — H34831 Tributary (branch) retinal vein occlusion, right eye: Secondary | ICD-10-CM | POA: Diagnosis not present

## 2015-03-31 ENCOUNTER — Encounter (INDEPENDENT_AMBULATORY_CARE_PROVIDER_SITE_OTHER): Payer: Medicare HMO | Admitting: Ophthalmology

## 2015-03-31 DIAGNOSIS — H35033 Hypertensive retinopathy, bilateral: Secondary | ICD-10-CM

## 2015-03-31 DIAGNOSIS — H2513 Age-related nuclear cataract, bilateral: Secondary | ICD-10-CM | POA: Diagnosis not present

## 2015-03-31 DIAGNOSIS — I10 Essential (primary) hypertension: Secondary | ICD-10-CM | POA: Diagnosis not present

## 2015-03-31 DIAGNOSIS — H34831 Tributary (branch) retinal vein occlusion, right eye: Secondary | ICD-10-CM | POA: Diagnosis not present

## 2015-03-31 DIAGNOSIS — H43813 Vitreous degeneration, bilateral: Secondary | ICD-10-CM | POA: Diagnosis not present

## 2015-04-28 ENCOUNTER — Encounter (INDEPENDENT_AMBULATORY_CARE_PROVIDER_SITE_OTHER): Payer: Medicare HMO | Admitting: Ophthalmology

## 2015-04-28 DIAGNOSIS — H34831 Tributary (branch) retinal vein occlusion, right eye: Secondary | ICD-10-CM

## 2015-04-28 DIAGNOSIS — H35033 Hypertensive retinopathy, bilateral: Secondary | ICD-10-CM

## 2015-04-28 DIAGNOSIS — I10 Essential (primary) hypertension: Secondary | ICD-10-CM | POA: Diagnosis not present

## 2015-04-28 DIAGNOSIS — H43813 Vitreous degeneration, bilateral: Secondary | ICD-10-CM | POA: Diagnosis not present

## 2015-05-26 ENCOUNTER — Encounter (INDEPENDENT_AMBULATORY_CARE_PROVIDER_SITE_OTHER): Payer: Medicare HMO | Admitting: Ophthalmology

## 2015-05-26 DIAGNOSIS — I10 Essential (primary) hypertension: Secondary | ICD-10-CM | POA: Diagnosis not present

## 2015-05-26 DIAGNOSIS — H34811 Central retinal vein occlusion, right eye: Secondary | ICD-10-CM | POA: Diagnosis not present

## 2015-05-26 DIAGNOSIS — H35033 Hypertensive retinopathy, bilateral: Secondary | ICD-10-CM | POA: Diagnosis not present

## 2015-05-26 DIAGNOSIS — H43813 Vitreous degeneration, bilateral: Secondary | ICD-10-CM

## 2015-05-26 DIAGNOSIS — H2513 Age-related nuclear cataract, bilateral: Secondary | ICD-10-CM

## 2015-07-03 ENCOUNTER — Encounter (INDEPENDENT_AMBULATORY_CARE_PROVIDER_SITE_OTHER): Payer: Medicare HMO | Admitting: Ophthalmology

## 2015-07-03 DIAGNOSIS — H34811 Central retinal vein occlusion, right eye: Secondary | ICD-10-CM | POA: Diagnosis not present

## 2015-07-03 DIAGNOSIS — H35033 Hypertensive retinopathy, bilateral: Secondary | ICD-10-CM | POA: Diagnosis not present

## 2015-07-03 DIAGNOSIS — H43813 Vitreous degeneration, bilateral: Secondary | ICD-10-CM | POA: Diagnosis not present

## 2015-07-03 DIAGNOSIS — H2513 Age-related nuclear cataract, bilateral: Secondary | ICD-10-CM | POA: Diagnosis not present

## 2015-07-03 DIAGNOSIS — I10 Essential (primary) hypertension: Secondary | ICD-10-CM

## 2015-08-06 ENCOUNTER — Encounter (INDEPENDENT_AMBULATORY_CARE_PROVIDER_SITE_OTHER): Payer: Medicare PPO | Admitting: Ophthalmology

## 2015-08-06 DIAGNOSIS — H35033 Hypertensive retinopathy, bilateral: Secondary | ICD-10-CM

## 2015-08-06 DIAGNOSIS — H2513 Age-related nuclear cataract, bilateral: Secondary | ICD-10-CM | POA: Diagnosis not present

## 2015-08-06 DIAGNOSIS — H43813 Vitreous degeneration, bilateral: Secondary | ICD-10-CM | POA: Diagnosis not present

## 2015-08-06 DIAGNOSIS — H34812 Central retinal vein occlusion, left eye: Secondary | ICD-10-CM | POA: Diagnosis not present

## 2015-08-06 DIAGNOSIS — I10 Essential (primary) hypertension: Secondary | ICD-10-CM | POA: Diagnosis not present

## 2015-09-04 ENCOUNTER — Encounter (INDEPENDENT_AMBULATORY_CARE_PROVIDER_SITE_OTHER): Payer: Medicare PPO | Admitting: Ophthalmology

## 2015-09-04 DIAGNOSIS — H35033 Hypertensive retinopathy, bilateral: Secondary | ICD-10-CM

## 2015-09-04 DIAGNOSIS — H43813 Vitreous degeneration, bilateral: Secondary | ICD-10-CM

## 2015-09-04 DIAGNOSIS — I10 Essential (primary) hypertension: Secondary | ICD-10-CM

## 2015-09-04 DIAGNOSIS — H34811 Central retinal vein occlusion, right eye: Secondary | ICD-10-CM

## 2015-09-04 DIAGNOSIS — H3531 Nonexudative age-related macular degeneration: Secondary | ICD-10-CM | POA: Diagnosis not present

## 2015-10-02 ENCOUNTER — Encounter (INDEPENDENT_AMBULATORY_CARE_PROVIDER_SITE_OTHER): Payer: Medicare PPO | Admitting: Ophthalmology

## 2015-10-02 DIAGNOSIS — H353121 Nonexudative age-related macular degeneration, left eye, early dry stage: Secondary | ICD-10-CM | POA: Diagnosis not present

## 2015-10-02 DIAGNOSIS — I10 Essential (primary) hypertension: Secondary | ICD-10-CM | POA: Diagnosis not present

## 2015-10-02 DIAGNOSIS — H43813 Vitreous degeneration, bilateral: Secondary | ICD-10-CM | POA: Diagnosis not present

## 2015-10-02 DIAGNOSIS — H353112 Nonexudative age-related macular degeneration, right eye, intermediate dry stage: Secondary | ICD-10-CM | POA: Diagnosis not present

## 2015-10-02 DIAGNOSIS — H2513 Age-related nuclear cataract, bilateral: Secondary | ICD-10-CM

## 2015-10-02 DIAGNOSIS — H34811 Central retinal vein occlusion, right eye, with macular edema: Secondary | ICD-10-CM

## 2015-10-02 DIAGNOSIS — H35033 Hypertensive retinopathy, bilateral: Secondary | ICD-10-CM

## 2015-11-13 ENCOUNTER — Encounter (INDEPENDENT_AMBULATORY_CARE_PROVIDER_SITE_OTHER): Payer: Medicare PPO | Admitting: Ophthalmology

## 2015-11-13 DIAGNOSIS — H43813 Vitreous degeneration, bilateral: Secondary | ICD-10-CM

## 2015-11-13 DIAGNOSIS — H35033 Hypertensive retinopathy, bilateral: Secondary | ICD-10-CM | POA: Diagnosis not present

## 2015-11-13 DIAGNOSIS — H2512 Age-related nuclear cataract, left eye: Secondary | ICD-10-CM | POA: Diagnosis not present

## 2015-11-13 DIAGNOSIS — I10 Essential (primary) hypertension: Secondary | ICD-10-CM

## 2015-11-13 DIAGNOSIS — H353121 Nonexudative age-related macular degeneration, left eye, early dry stage: Secondary | ICD-10-CM | POA: Diagnosis not present

## 2015-11-13 DIAGNOSIS — H34811 Central retinal vein occlusion, right eye, with macular edema: Secondary | ICD-10-CM

## 2015-11-13 DIAGNOSIS — H353112 Nonexudative age-related macular degeneration, right eye, intermediate dry stage: Secondary | ICD-10-CM

## 2015-12-25 ENCOUNTER — Encounter (INDEPENDENT_AMBULATORY_CARE_PROVIDER_SITE_OTHER): Payer: Medicare Other | Admitting: Ophthalmology

## 2015-12-25 DIAGNOSIS — H353131 Nonexudative age-related macular degeneration, bilateral, early dry stage: Secondary | ICD-10-CM | POA: Diagnosis not present

## 2015-12-25 DIAGNOSIS — I10 Essential (primary) hypertension: Secondary | ICD-10-CM

## 2015-12-25 DIAGNOSIS — H34811 Central retinal vein occlusion, right eye, with macular edema: Secondary | ICD-10-CM | POA: Diagnosis not present

## 2015-12-25 DIAGNOSIS — H43813 Vitreous degeneration, bilateral: Secondary | ICD-10-CM | POA: Diagnosis not present

## 2015-12-25 DIAGNOSIS — H35033 Hypertensive retinopathy, bilateral: Secondary | ICD-10-CM | POA: Diagnosis not present

## 2016-02-05 ENCOUNTER — Encounter (INDEPENDENT_AMBULATORY_CARE_PROVIDER_SITE_OTHER): Payer: Medicare Other | Admitting: Ophthalmology

## 2016-02-05 DIAGNOSIS — H43813 Vitreous degeneration, bilateral: Secondary | ICD-10-CM | POA: Diagnosis not present

## 2016-02-05 DIAGNOSIS — H34811 Central retinal vein occlusion, right eye, with macular edema: Secondary | ICD-10-CM | POA: Diagnosis not present

## 2016-02-05 DIAGNOSIS — I10 Essential (primary) hypertension: Secondary | ICD-10-CM

## 2016-02-05 DIAGNOSIS — H35033 Hypertensive retinopathy, bilateral: Secondary | ICD-10-CM | POA: Diagnosis not present

## 2016-03-03 ENCOUNTER — Encounter (INDEPENDENT_AMBULATORY_CARE_PROVIDER_SITE_OTHER): Payer: Medicare Other | Admitting: Ophthalmology

## 2016-03-03 DIAGNOSIS — H43813 Vitreous degeneration, bilateral: Secondary | ICD-10-CM | POA: Diagnosis not present

## 2016-03-03 DIAGNOSIS — H35033 Hypertensive retinopathy, bilateral: Secondary | ICD-10-CM | POA: Diagnosis not present

## 2016-03-03 DIAGNOSIS — I10 Essential (primary) hypertension: Secondary | ICD-10-CM

## 2016-03-03 DIAGNOSIS — H34811 Central retinal vein occlusion, right eye, with macular edema: Secondary | ICD-10-CM

## 2016-03-25 ENCOUNTER — Encounter (INDEPENDENT_AMBULATORY_CARE_PROVIDER_SITE_OTHER): Payer: Medicare Other | Admitting: Ophthalmology

## 2016-03-25 DIAGNOSIS — H353111 Nonexudative age-related macular degeneration, right eye, early dry stage: Secondary | ICD-10-CM | POA: Diagnosis not present

## 2016-03-25 DIAGNOSIS — I10 Essential (primary) hypertension: Secondary | ICD-10-CM | POA: Diagnosis not present

## 2016-03-25 DIAGNOSIS — H34831 Tributary (branch) retinal vein occlusion, right eye, with macular edema: Secondary | ICD-10-CM

## 2016-03-25 DIAGNOSIS — H43813 Vitreous degeneration, bilateral: Secondary | ICD-10-CM | POA: Diagnosis not present

## 2016-03-25 DIAGNOSIS — H35033 Hypertensive retinopathy, bilateral: Secondary | ICD-10-CM | POA: Diagnosis not present

## 2016-05-06 ENCOUNTER — Encounter (INDEPENDENT_AMBULATORY_CARE_PROVIDER_SITE_OTHER): Payer: Medicare Other | Admitting: Ophthalmology

## 2016-05-06 DIAGNOSIS — H35033 Hypertensive retinopathy, bilateral: Secondary | ICD-10-CM | POA: Diagnosis not present

## 2016-05-06 DIAGNOSIS — I10 Essential (primary) hypertension: Secondary | ICD-10-CM | POA: Diagnosis not present

## 2016-05-06 DIAGNOSIS — H34831 Tributary (branch) retinal vein occlusion, right eye, with macular edema: Secondary | ICD-10-CM | POA: Diagnosis not present

## 2016-05-06 DIAGNOSIS — H43813 Vitreous degeneration, bilateral: Secondary | ICD-10-CM

## 2016-06-28 ENCOUNTER — Encounter (INDEPENDENT_AMBULATORY_CARE_PROVIDER_SITE_OTHER): Payer: Medicare Other | Admitting: Ophthalmology

## 2016-06-28 DIAGNOSIS — H35033 Hypertensive retinopathy, bilateral: Secondary | ICD-10-CM

## 2016-06-28 DIAGNOSIS — I10 Essential (primary) hypertension: Secondary | ICD-10-CM | POA: Diagnosis not present

## 2016-06-28 DIAGNOSIS — H34831 Tributary (branch) retinal vein occlusion, right eye, with macular edema: Secondary | ICD-10-CM | POA: Diagnosis not present

## 2016-06-28 DIAGNOSIS — H353112 Nonexudative age-related macular degeneration, right eye, intermediate dry stage: Secondary | ICD-10-CM | POA: Diagnosis not present

## 2016-06-28 DIAGNOSIS — H43813 Vitreous degeneration, bilateral: Secondary | ICD-10-CM

## 2016-08-13 ENCOUNTER — Encounter (INDEPENDENT_AMBULATORY_CARE_PROVIDER_SITE_OTHER): Payer: Medicare Other | Admitting: Ophthalmology

## 2016-08-13 DIAGNOSIS — H35033 Hypertensive retinopathy, bilateral: Secondary | ICD-10-CM | POA: Diagnosis not present

## 2016-08-13 DIAGNOSIS — H4313 Vitreous hemorrhage, bilateral: Secondary | ICD-10-CM | POA: Diagnosis not present

## 2016-08-13 DIAGNOSIS — H353112 Nonexudative age-related macular degeneration, right eye, intermediate dry stage: Secondary | ICD-10-CM

## 2016-08-13 DIAGNOSIS — H34831 Tributary (branch) retinal vein occlusion, right eye, with macular edema: Secondary | ICD-10-CM | POA: Diagnosis not present

## 2016-08-13 DIAGNOSIS — I10 Essential (primary) hypertension: Secondary | ICD-10-CM | POA: Diagnosis not present

## 2016-09-27 ENCOUNTER — Encounter (INDEPENDENT_AMBULATORY_CARE_PROVIDER_SITE_OTHER): Payer: Medicare Other | Admitting: Ophthalmology

## 2016-09-27 DIAGNOSIS — H34831 Tributary (branch) retinal vein occlusion, right eye, with macular edema: Secondary | ICD-10-CM | POA: Diagnosis not present

## 2016-09-27 DIAGNOSIS — H43813 Vitreous degeneration, bilateral: Secondary | ICD-10-CM | POA: Diagnosis not present

## 2016-09-27 DIAGNOSIS — H35033 Hypertensive retinopathy, bilateral: Secondary | ICD-10-CM

## 2016-09-27 DIAGNOSIS — I10 Essential (primary) hypertension: Secondary | ICD-10-CM

## 2016-09-27 DIAGNOSIS — H353112 Nonexudative age-related macular degeneration, right eye, intermediate dry stage: Secondary | ICD-10-CM | POA: Diagnosis not present

## 2016-11-08 ENCOUNTER — Encounter (INDEPENDENT_AMBULATORY_CARE_PROVIDER_SITE_OTHER): Payer: Medicare Other | Admitting: Ophthalmology

## 2016-11-08 DIAGNOSIS — H43813 Vitreous degeneration, bilateral: Secondary | ICD-10-CM

## 2016-11-08 DIAGNOSIS — H34831 Tributary (branch) retinal vein occlusion, right eye, with macular edema: Secondary | ICD-10-CM | POA: Diagnosis not present

## 2016-11-08 DIAGNOSIS — I10 Essential (primary) hypertension: Secondary | ICD-10-CM | POA: Diagnosis not present

## 2016-11-08 DIAGNOSIS — H35033 Hypertensive retinopathy, bilateral: Secondary | ICD-10-CM

## 2016-12-27 ENCOUNTER — Encounter (INDEPENDENT_AMBULATORY_CARE_PROVIDER_SITE_OTHER): Payer: Medicare Other | Admitting: Ophthalmology

## 2016-12-27 DIAGNOSIS — H43813 Vitreous degeneration, bilateral: Secondary | ICD-10-CM | POA: Diagnosis not present

## 2016-12-27 DIAGNOSIS — H35033 Hypertensive retinopathy, bilateral: Secondary | ICD-10-CM | POA: Diagnosis not present

## 2016-12-27 DIAGNOSIS — H34831 Tributary (branch) retinal vein occlusion, right eye, with macular edema: Secondary | ICD-10-CM | POA: Diagnosis not present

## 2016-12-27 DIAGNOSIS — I10 Essential (primary) hypertension: Secondary | ICD-10-CM | POA: Diagnosis not present

## 2017-02-14 ENCOUNTER — Encounter (INDEPENDENT_AMBULATORY_CARE_PROVIDER_SITE_OTHER): Payer: Medicare Other | Admitting: Ophthalmology

## 2017-02-14 DIAGNOSIS — H35033 Hypertensive retinopathy, bilateral: Secondary | ICD-10-CM

## 2017-02-14 DIAGNOSIS — H43813 Vitreous degeneration, bilateral: Secondary | ICD-10-CM

## 2017-02-14 DIAGNOSIS — I10 Essential (primary) hypertension: Secondary | ICD-10-CM | POA: Diagnosis not present

## 2017-02-14 DIAGNOSIS — H34831 Tributary (branch) retinal vein occlusion, right eye, with macular edema: Secondary | ICD-10-CM | POA: Diagnosis not present

## 2017-03-14 DIAGNOSIS — I10 Essential (primary) hypertension: Secondary | ICD-10-CM | POA: Insufficient documentation

## 2017-04-04 ENCOUNTER — Encounter (INDEPENDENT_AMBULATORY_CARE_PROVIDER_SITE_OTHER): Payer: Medicare Other | Admitting: Ophthalmology

## 2017-04-04 DIAGNOSIS — H43813 Vitreous degeneration, bilateral: Secondary | ICD-10-CM | POA: Diagnosis not present

## 2017-04-04 DIAGNOSIS — I10 Essential (primary) hypertension: Secondary | ICD-10-CM

## 2017-04-04 DIAGNOSIS — H34831 Tributary (branch) retinal vein occlusion, right eye, with macular edema: Secondary | ICD-10-CM

## 2017-04-04 DIAGNOSIS — H35033 Hypertensive retinopathy, bilateral: Secondary | ICD-10-CM

## 2017-04-28 ENCOUNTER — Other Ambulatory Visit (HOSPITAL_COMMUNITY): Payer: Self-pay | Admitting: Family Medicine

## 2017-04-28 DIAGNOSIS — Z780 Asymptomatic menopausal state: Secondary | ICD-10-CM

## 2017-05-04 ENCOUNTER — Ambulatory Visit (HOSPITAL_COMMUNITY)
Admission: RE | Admit: 2017-05-04 | Discharge: 2017-05-04 | Disposition: A | Discharge status: Home / Self Care | Payer: Medicare Other | Source: Ambulatory Visit | Attending: Family Medicine | Admitting: Family Medicine

## 2017-05-04 DIAGNOSIS — M85852 Other specified disorders of bone density and structure, left thigh: Secondary | ICD-10-CM | POA: Diagnosis not present

## 2017-05-04 DIAGNOSIS — Z780 Asymptomatic menopausal state: Secondary | ICD-10-CM | POA: Diagnosis present

## 2017-05-24 ENCOUNTER — Encounter (INDEPENDENT_AMBULATORY_CARE_PROVIDER_SITE_OTHER): Payer: Medicare Other | Admitting: Ophthalmology

## 2017-05-24 DIAGNOSIS — I10 Essential (primary) hypertension: Secondary | ICD-10-CM

## 2017-05-24 DIAGNOSIS — H43813 Vitreous degeneration, bilateral: Secondary | ICD-10-CM | POA: Diagnosis not present

## 2017-05-24 DIAGNOSIS — H35033 Hypertensive retinopathy, bilateral: Secondary | ICD-10-CM | POA: Diagnosis not present

## 2017-05-24 DIAGNOSIS — H34831 Tributary (branch) retinal vein occlusion, right eye, with macular edema: Secondary | ICD-10-CM | POA: Diagnosis not present

## 2017-07-12 ENCOUNTER — Encounter (INDEPENDENT_AMBULATORY_CARE_PROVIDER_SITE_OTHER): Payer: Medicare Other | Admitting: Ophthalmology

## 2017-07-12 DIAGNOSIS — H353112 Nonexudative age-related macular degeneration, right eye, intermediate dry stage: Secondary | ICD-10-CM | POA: Diagnosis not present

## 2017-07-12 DIAGNOSIS — H35033 Hypertensive retinopathy, bilateral: Secondary | ICD-10-CM

## 2017-07-12 DIAGNOSIS — I10 Essential (primary) hypertension: Secondary | ICD-10-CM

## 2017-07-12 DIAGNOSIS — H43813 Vitreous degeneration, bilateral: Secondary | ICD-10-CM | POA: Diagnosis not present

## 2017-07-12 DIAGNOSIS — H34831 Tributary (branch) retinal vein occlusion, right eye, with macular edema: Secondary | ICD-10-CM

## 2017-08-30 ENCOUNTER — Encounter (INDEPENDENT_AMBULATORY_CARE_PROVIDER_SITE_OTHER): Payer: Medicare Other | Admitting: Ophthalmology

## 2017-08-30 DIAGNOSIS — H35033 Hypertensive retinopathy, bilateral: Secondary | ICD-10-CM

## 2017-08-30 DIAGNOSIS — H43813 Vitreous degeneration, bilateral: Secondary | ICD-10-CM | POA: Diagnosis not present

## 2017-08-30 DIAGNOSIS — H34831 Tributary (branch) retinal vein occlusion, right eye, with macular edema: Secondary | ICD-10-CM | POA: Diagnosis not present

## 2017-08-30 DIAGNOSIS — H353112 Nonexudative age-related macular degeneration, right eye, intermediate dry stage: Secondary | ICD-10-CM | POA: Diagnosis not present

## 2017-08-30 DIAGNOSIS — I10 Essential (primary) hypertension: Secondary | ICD-10-CM

## 2017-10-18 ENCOUNTER — Encounter (INDEPENDENT_AMBULATORY_CARE_PROVIDER_SITE_OTHER): Payer: Medicare Other | Admitting: Ophthalmology

## 2017-10-18 DIAGNOSIS — I10 Essential (primary) hypertension: Secondary | ICD-10-CM

## 2017-10-18 DIAGNOSIS — H34831 Tributary (branch) retinal vein occlusion, right eye, with macular edema: Secondary | ICD-10-CM | POA: Diagnosis not present

## 2017-10-18 DIAGNOSIS — H35033 Hypertensive retinopathy, bilateral: Secondary | ICD-10-CM

## 2017-10-18 DIAGNOSIS — H43813 Vitreous degeneration, bilateral: Secondary | ICD-10-CM | POA: Diagnosis not present

## 2017-12-08 ENCOUNTER — Encounter (INDEPENDENT_AMBULATORY_CARE_PROVIDER_SITE_OTHER): Payer: Medicare Other | Admitting: Ophthalmology

## 2017-12-08 DIAGNOSIS — H35033 Hypertensive retinopathy, bilateral: Secondary | ICD-10-CM

## 2017-12-08 DIAGNOSIS — H34831 Tributary (branch) retinal vein occlusion, right eye, with macular edema: Secondary | ICD-10-CM | POA: Diagnosis not present

## 2017-12-08 DIAGNOSIS — H353112 Nonexudative age-related macular degeneration, right eye, intermediate dry stage: Secondary | ICD-10-CM | POA: Diagnosis not present

## 2017-12-08 DIAGNOSIS — H43813 Vitreous degeneration, bilateral: Secondary | ICD-10-CM | POA: Diagnosis not present

## 2017-12-08 DIAGNOSIS — I10 Essential (primary) hypertension: Secondary | ICD-10-CM

## 2018-02-02 ENCOUNTER — Encounter (INDEPENDENT_AMBULATORY_CARE_PROVIDER_SITE_OTHER): Payer: Medicare Other | Admitting: Ophthalmology

## 2018-02-02 DIAGNOSIS — H43813 Vitreous degeneration, bilateral: Secondary | ICD-10-CM

## 2018-02-02 DIAGNOSIS — I10 Essential (primary) hypertension: Secondary | ICD-10-CM

## 2018-02-02 DIAGNOSIS — H353112 Nonexudative age-related macular degeneration, right eye, intermediate dry stage: Secondary | ICD-10-CM | POA: Diagnosis not present

## 2018-02-02 DIAGNOSIS — H35033 Hypertensive retinopathy, bilateral: Secondary | ICD-10-CM

## 2018-02-02 DIAGNOSIS — H34831 Tributary (branch) retinal vein occlusion, right eye, with macular edema: Secondary | ICD-10-CM

## 2018-03-23 ENCOUNTER — Encounter (INDEPENDENT_AMBULATORY_CARE_PROVIDER_SITE_OTHER): Payer: Medicare Other | Admitting: Ophthalmology

## 2018-03-23 DIAGNOSIS — H43813 Vitreous degeneration, bilateral: Secondary | ICD-10-CM

## 2018-03-23 DIAGNOSIS — H34831 Tributary (branch) retinal vein occlusion, right eye, with macular edema: Secondary | ICD-10-CM | POA: Diagnosis not present

## 2018-03-23 DIAGNOSIS — H353112 Nonexudative age-related macular degeneration, right eye, intermediate dry stage: Secondary | ICD-10-CM | POA: Diagnosis not present

## 2018-03-23 DIAGNOSIS — H35033 Hypertensive retinopathy, bilateral: Secondary | ICD-10-CM

## 2018-03-23 DIAGNOSIS — I10 Essential (primary) hypertension: Secondary | ICD-10-CM

## 2018-05-17 ENCOUNTER — Encounter (INDEPENDENT_AMBULATORY_CARE_PROVIDER_SITE_OTHER): Payer: Medicare Other | Admitting: Ophthalmology

## 2018-05-17 DIAGNOSIS — H34831 Tributary (branch) retinal vein occlusion, right eye, with macular edema: Secondary | ICD-10-CM

## 2018-05-17 DIAGNOSIS — H43813 Vitreous degeneration, bilateral: Secondary | ICD-10-CM | POA: Diagnosis not present

## 2018-05-17 DIAGNOSIS — H35033 Hypertensive retinopathy, bilateral: Secondary | ICD-10-CM | POA: Diagnosis not present

## 2018-05-17 DIAGNOSIS — I10 Essential (primary) hypertension: Secondary | ICD-10-CM | POA: Diagnosis not present

## 2018-06-28 ENCOUNTER — Encounter (INDEPENDENT_AMBULATORY_CARE_PROVIDER_SITE_OTHER): Payer: Medicare Other | Admitting: Ophthalmology

## 2018-06-30 ENCOUNTER — Encounter (INDEPENDENT_AMBULATORY_CARE_PROVIDER_SITE_OTHER): Payer: Medicare Other | Admitting: Ophthalmology

## 2018-06-30 DIAGNOSIS — H34831 Tributary (branch) retinal vein occlusion, right eye, with macular edema: Secondary | ICD-10-CM | POA: Diagnosis not present

## 2018-06-30 DIAGNOSIS — H43813 Vitreous degeneration, bilateral: Secondary | ICD-10-CM

## 2018-06-30 DIAGNOSIS — I10 Essential (primary) hypertension: Secondary | ICD-10-CM

## 2018-06-30 DIAGNOSIS — H35033 Hypertensive retinopathy, bilateral: Secondary | ICD-10-CM

## 2018-08-11 ENCOUNTER — Encounter (INDEPENDENT_AMBULATORY_CARE_PROVIDER_SITE_OTHER): Payer: Medicare Other | Admitting: Ophthalmology

## 2018-08-11 DIAGNOSIS — H34831 Tributary (branch) retinal vein occlusion, right eye, with macular edema: Secondary | ICD-10-CM | POA: Diagnosis not present

## 2018-08-11 DIAGNOSIS — I10 Essential (primary) hypertension: Secondary | ICD-10-CM

## 2018-08-11 DIAGNOSIS — H43813 Vitreous degeneration, bilateral: Secondary | ICD-10-CM | POA: Diagnosis not present

## 2018-08-11 DIAGNOSIS — H35033 Hypertensive retinopathy, bilateral: Secondary | ICD-10-CM | POA: Diagnosis not present

## 2018-09-22 ENCOUNTER — Encounter (INDEPENDENT_AMBULATORY_CARE_PROVIDER_SITE_OTHER): Payer: Medicare Other | Admitting: Ophthalmology

## 2018-09-22 DIAGNOSIS — H34831 Tributary (branch) retinal vein occlusion, right eye, with macular edema: Secondary | ICD-10-CM | POA: Diagnosis not present

## 2018-09-22 DIAGNOSIS — H35033 Hypertensive retinopathy, bilateral: Secondary | ICD-10-CM | POA: Diagnosis not present

## 2018-09-22 DIAGNOSIS — I10 Essential (primary) hypertension: Secondary | ICD-10-CM | POA: Diagnosis not present

## 2018-09-22 DIAGNOSIS — H43813 Vitreous degeneration, bilateral: Secondary | ICD-10-CM

## 2018-11-06 ENCOUNTER — Encounter (INDEPENDENT_AMBULATORY_CARE_PROVIDER_SITE_OTHER): Payer: Medicare Other | Admitting: Ophthalmology

## 2018-11-06 DIAGNOSIS — I10 Essential (primary) hypertension: Secondary | ICD-10-CM | POA: Diagnosis not present

## 2018-11-06 DIAGNOSIS — H34831 Tributary (branch) retinal vein occlusion, right eye, with macular edema: Secondary | ICD-10-CM

## 2018-11-06 DIAGNOSIS — H43813 Vitreous degeneration, bilateral: Secondary | ICD-10-CM

## 2018-11-06 DIAGNOSIS — H35033 Hypertensive retinopathy, bilateral: Secondary | ICD-10-CM

## 2018-11-17 ENCOUNTER — Encounter (INDEPENDENT_AMBULATORY_CARE_PROVIDER_SITE_OTHER): Payer: Medicare Other | Admitting: Ophthalmology

## 2018-11-17 DIAGNOSIS — H43813 Vitreous degeneration, bilateral: Secondary | ICD-10-CM | POA: Diagnosis not present

## 2018-11-17 DIAGNOSIS — H35033 Hypertensive retinopathy, bilateral: Secondary | ICD-10-CM

## 2018-11-17 DIAGNOSIS — I10 Essential (primary) hypertension: Secondary | ICD-10-CM | POA: Diagnosis not present

## 2018-11-17 DIAGNOSIS — H34831 Tributary (branch) retinal vein occlusion, right eye, with macular edema: Secondary | ICD-10-CM | POA: Diagnosis not present

## 2019-01-01 ENCOUNTER — Encounter (INDEPENDENT_AMBULATORY_CARE_PROVIDER_SITE_OTHER): Payer: Medicare Other | Admitting: Ophthalmology

## 2019-01-01 DIAGNOSIS — H34831 Tributary (branch) retinal vein occlusion, right eye, with macular edema: Secondary | ICD-10-CM | POA: Diagnosis not present

## 2019-01-01 DIAGNOSIS — H35033 Hypertensive retinopathy, bilateral: Secondary | ICD-10-CM | POA: Diagnosis not present

## 2019-01-01 DIAGNOSIS — H43813 Vitreous degeneration, bilateral: Secondary | ICD-10-CM

## 2019-01-01 DIAGNOSIS — I10 Essential (primary) hypertension: Secondary | ICD-10-CM | POA: Diagnosis not present

## 2019-01-29 ENCOUNTER — Encounter (INDEPENDENT_AMBULATORY_CARE_PROVIDER_SITE_OTHER): Payer: Medicare Other | Admitting: Ophthalmology

## 2019-01-29 DIAGNOSIS — H34831 Tributary (branch) retinal vein occlusion, right eye, with macular edema: Secondary | ICD-10-CM | POA: Diagnosis not present

## 2019-01-29 DIAGNOSIS — H35033 Hypertensive retinopathy, bilateral: Secondary | ICD-10-CM

## 2019-01-29 DIAGNOSIS — H43813 Vitreous degeneration, bilateral: Secondary | ICD-10-CM | POA: Diagnosis not present

## 2019-01-29 DIAGNOSIS — I10 Essential (primary) hypertension: Secondary | ICD-10-CM | POA: Diagnosis not present

## 2019-03-09 ENCOUNTER — Encounter (INDEPENDENT_AMBULATORY_CARE_PROVIDER_SITE_OTHER): Payer: Medicare Other | Admitting: Ophthalmology

## 2019-03-09 ENCOUNTER — Other Ambulatory Visit: Payer: Self-pay

## 2019-03-09 DIAGNOSIS — H43813 Vitreous degeneration, bilateral: Secondary | ICD-10-CM | POA: Diagnosis not present

## 2019-03-09 DIAGNOSIS — H34831 Tributary (branch) retinal vein occlusion, right eye, with macular edema: Secondary | ICD-10-CM | POA: Diagnosis not present

## 2019-03-09 DIAGNOSIS — H35033 Hypertensive retinopathy, bilateral: Secondary | ICD-10-CM

## 2019-03-09 DIAGNOSIS — I10 Essential (primary) hypertension: Secondary | ICD-10-CM

## 2019-04-20 ENCOUNTER — Encounter (INDEPENDENT_AMBULATORY_CARE_PROVIDER_SITE_OTHER): Payer: Medicare Other | Admitting: Ophthalmology

## 2019-04-20 ENCOUNTER — Other Ambulatory Visit: Payer: Self-pay

## 2019-04-20 DIAGNOSIS — H34831 Tributary (branch) retinal vein occlusion, right eye, with macular edema: Secondary | ICD-10-CM | POA: Diagnosis not present

## 2019-04-20 DIAGNOSIS — I10 Essential (primary) hypertension: Secondary | ICD-10-CM

## 2019-04-20 DIAGNOSIS — H35033 Hypertensive retinopathy, bilateral: Secondary | ICD-10-CM

## 2019-04-20 DIAGNOSIS — H43813 Vitreous degeneration, bilateral: Secondary | ICD-10-CM | POA: Diagnosis not present

## 2019-06-01 ENCOUNTER — Encounter (INDEPENDENT_AMBULATORY_CARE_PROVIDER_SITE_OTHER): Payer: Medicare Other | Admitting: Ophthalmology

## 2019-06-01 ENCOUNTER — Other Ambulatory Visit: Payer: Self-pay

## 2019-06-01 DIAGNOSIS — H43813 Vitreous degeneration, bilateral: Secondary | ICD-10-CM

## 2019-06-01 DIAGNOSIS — I10 Essential (primary) hypertension: Secondary | ICD-10-CM | POA: Diagnosis not present

## 2019-06-01 DIAGNOSIS — H35033 Hypertensive retinopathy, bilateral: Secondary | ICD-10-CM

## 2019-06-01 DIAGNOSIS — H34831 Tributary (branch) retinal vein occlusion, right eye, with macular edema: Secondary | ICD-10-CM

## 2019-06-14 ENCOUNTER — Other Ambulatory Visit: Payer: Self-pay

## 2019-06-15 ENCOUNTER — Ambulatory Visit: Payer: Medicare Other | Admitting: Physician Assistant

## 2019-06-15 ENCOUNTER — Encounter: Payer: Self-pay | Admitting: Physician Assistant

## 2019-06-15 VITALS — BP 146/84 | HR 82 | Temp 98.3°F | Ht 62.0 in | Wt 210.8 lb

## 2019-06-15 DIAGNOSIS — I10 Essential (primary) hypertension: Secondary | ICD-10-CM

## 2019-06-15 DIAGNOSIS — E7841 Elevated Lipoprotein(a): Secondary | ICD-10-CM

## 2019-06-15 DIAGNOSIS — E039 Hypothyroidism, unspecified: Secondary | ICD-10-CM

## 2019-06-15 DIAGNOSIS — Z0000 Encounter for general adult medical examination without abnormal findings: Secondary | ICD-10-CM

## 2019-06-15 MED ORDER — AMLODIPINE BESYLATE 5 MG PO TABS: 5.0000 mg | ORAL_TABLET | Freq: Every day | ORAL | 3 refills | Status: DC

## 2019-06-15 MED ORDER — DOXYCYCLINE HYCLATE 100 MG PO TABS: 100.0000 mg | ORAL_TABLET | Freq: Two times a day (BID) | ORAL | 0 refills | Status: DC

## 2019-06-15 MED ORDER — PRAVASTATIN SODIUM 80 MG PO TABS: 80.0000 mg | ORAL_TABLET | Freq: Every day | ORAL | 3 refills | Status: DC

## 2019-06-15 MED ORDER — LISINOPRIL 10 MG PO TABS: 10.0000 mg | ORAL_TABLET | Freq: Every day | ORAL | 3 refills | Status: DC

## 2019-06-15 MED ORDER — SYNTHROID 125 MCG PO TABS: 125.0000 ug | ORAL_TABLET | Freq: Every day | ORAL | 3 refills | Status: DC

## 2019-06-16 LAB — CBC WITH DIFFERENTIAL/PLATELET
Basophils Absolute: 0 10*3/uL (ref 0.0–0.2)
Basos: 0 %
EOS (ABSOLUTE): 0.1 10*3/uL (ref 0.0–0.4)
Eos: 1 %
Hematocrit: 44 % (ref 34.0–46.6)
Hemoglobin: 14.8 g/dL (ref 11.1–15.9)
Immature Grans (Abs): 0 10*3/uL (ref 0.0–0.1)
Immature Granulocytes: 0 %
Lymphocytes Absolute: 2.9 10*3/uL (ref 0.7–3.1)
Lymphs: 51 %
MCH: 29.4 pg (ref 26.6–33.0)
MCHC: 33.6 g/dL (ref 31.5–35.7)
MCV: 87 fL (ref 79–97)
Monocytes Absolute: 0.6 10*3/uL (ref 0.1–0.9)
Monocytes: 9 %
Neutrophils Absolute: 2.3 10*3/uL (ref 1.4–7.0)
Neutrophils: 39 %
Platelets: 315 10*3/uL (ref 150–450)
RBC: 5.04 x10E6/uL (ref 3.77–5.28)
RDW: 13.2 % (ref 11.7–15.4)
WBC: 5.9 10*3/uL (ref 3.4–10.8)

## 2019-06-16 LAB — CMP14+EGFR
ALT: 19 IU/L (ref 0–32)
AST: 30 IU/L (ref 0–40)
Albumin/Globulin Ratio: 1.9 (ref 1.2–2.2)
Albumin: 4.6 g/dL (ref 3.7–4.7)
Alkaline Phosphatase: 63 IU/L (ref 39–117)
BUN/Creatinine Ratio: 13 (ref 12–28)
BUN: 11 mg/dL (ref 8–27)
Bilirubin Total: 0.4 mg/dL (ref 0.0–1.2)
CO2: 21 mmol/L (ref 20–29)
Calcium: 9.4 mg/dL (ref 8.7–10.3)
Chloride: 102 mmol/L (ref 96–106)
Creatinine, Ser: 0.85 mg/dL (ref 0.57–1.00)
GFR calc Af Amer: 75 mL/min/{1.73_m2} (ref >59)
GFR calc non Af Amer: 65 mL/min/{1.73_m2} (ref >59)
Globulin, Total: 2.4 g/dL (ref 1.5–4.5)
Glucose: 97 mg/dL (ref 65–99)
Potassium: 4.3 mmol/L (ref 3.5–5.2)
Sodium: 139 mmol/L (ref 134–144)
Total Protein: 7 g/dL (ref 6.0–8.5)

## 2019-06-16 LAB — LIPID PANEL
Chol/HDL Ratio: 2.8 ratio (ref 0.0–4.4)
Cholesterol, Total: 172 mg/dL (ref 100–199)
HDL: 62 mg/dL (ref >39)
LDL Calculated: 88 mg/dL (ref 0–99)
Triglycerides: 110 mg/dL (ref 0–149)
VLDL Cholesterol Cal: 22 mg/dL (ref 5–40)

## 2019-06-16 LAB — TSH: TSH: 0.99 u[IU]/mL (ref 0.450–4.500)

## 2019-06-18 NOTE — Progress Notes (Signed)
BP (!) 146/84   Pulse 82   Temp 98.3 F (36.8 C) (Oral)   Ht 5' 2"  (1.575 m)   Wt 210 lb 12.8 oz (95.6 kg)   BMI 38.56 kg/m    Subjective:    Patient ID: Chelsea Finley, female    DOB: 11/01/1941, 78 y.o.   MRN: [id number]  HPI: Chelsea Finley is a 78 y.o. female presenting on 06/15/2019 for New Patient (Initial Visit) and Establish Care  This patient comes in for an initial visit today to be established with [place].  She was previously a patient of Dr. Percell Locus.  Overall she has been doing fairly well not having any difficulties.  Her primary medical conditions do include hypertension, thyroid disease, osteoarthritis, hyperlipidemia.  She does need labs to be performed.  All of the medications will be refilled.  She has had retina surgery performed.  She also has had shoulder surgery and knee.  They are not giving in difficulties at this time.  Past Medical History:  Diagnosis Date  . Arthritis    OSTEO ARTHRITIS  . Hyperlipidemia   . Hypertension   . Thyroid disease    Relevant past medical, surgical, family and social history reviewed and updated as indicated. Interim medical history since our last visit reviewed. Allergies and medications reviewed and updated. DATA REVIEWED: CHART IN EPIC  Family History reviewed for pertinent findings.  Review of Systems  Constitutional: Negative.   HENT: Negative.   Eyes: Negative.   Respiratory: Negative.   Gastrointestinal: Negative.   Genitourinary: Negative.     Allergies as of 06/15/2019      Reactions   Morphine And Related Nausea And Vomiting      Medication List       Accurate as of June 15, 2019 11:59 PM. If you have any questions, ask your nurse or doctor.        STOP taking these medications   Aleve 220 MG tablet Generic drug: naproxen sodium Stopped by: Terald Sleeper, PA-C   hydrochlorothiazide 25 MG tablet Commonly known as: HYDRODIURIL Stopped by: Terald Sleeper, PA-C   rivaroxaban 10 MG Tabs  tablet Commonly known as: XARELTO Stopped by: Terald Sleeper, PA-C     TAKE these medications   amLODipine 5 MG tablet Commonly known as: NORVASC Take 1 tablet (5 mg total) by mouth daily.   doxycycline 100 MG tablet Commonly known as: VIBRA-TABS Take 1 tablet (100 mg total) by mouth 2 (two) times daily. 1 po bid Started by: Terald Sleeper, PA-C   lisinopril 10 MG tablet Commonly known as: ZESTRIL Take 1 tablet (10 mg total) by mouth daily. What changed:   how much to take  how to take this  when to take this Changed by: Terald Sleeper, PA-C   pravastatin 80 MG tablet Commonly known as: PRAVACHOL Take 1 tablet (80 mg total) by mouth daily.   Synthroid 125 MCG tablet Generic drug: levothyroxine Take 1 tablet (125 mcg total) by mouth daily before breakfast. What changed:   how much to take  how to take this  when to take this Changed by: Terald Sleeper, PA-C          Objective:    BP (!) 146/84   Pulse 82   Temp 98.3 F (36.8 C) (Oral)   Ht 5' 2"  (1.575 m)   Wt 210 lb 12.8 oz (95.6 kg)   BMI 38.56 kg/m   Allergies  Allergen  Reactions  . Morphine And Related Nausea And Vomiting    Wt Readings from Last 3 Encounters:  06/15/19 210 lb 12.8 oz (95.6 kg)  10/11/14 182 lb (82.6 kg)  10/11/14 182 lb (82.6 kg)    Physical Exam Constitutional:      Appearance: She is well-developed.  HENT:     Head: Normocephalic and atraumatic.  Eyes:     Conjunctiva/sclera: Conjunctivae normal.     Pupils: Pupils are equal, round, and reactive to light.  Cardiovascular:     Rate and Rhythm: Normal rate and regular rhythm.     Heart sounds: Normal heart sounds.  Pulmonary:     Effort: Pulmonary effort is normal.     Breath sounds: Normal breath sounds.  Abdominal:     General: Bowel sounds are normal.     Palpations: Abdomen is soft.  Skin:    General: Skin is warm and dry.     Findings: No rash.  Neurological:     Mental Status: She is alert and oriented to  person, place, and time.     Deep Tendon Reflexes: Reflexes are normal and symmetric.  Psychiatric:        Behavior: Behavior normal.        Thought Content: Thought content normal.        Judgment: Judgment normal.     Results for orders placed or performed in visit on 06/15/19  CMP14+EGFR  Result Value Ref Range   Glucose 97 65 - 99 mg/dL   BUN 11 8 - 27 mg/dL   Creatinine, Ser 0.85 0.57 - 1.00 mg/dL   GFR calc non Af Amer 65 >59 mL/min/1.73   GFR calc Af Amer 75 >59 mL/min/1.73   BUN/Creatinine Ratio 13 12 - 28   Sodium 139 134 - 144 mmol/L   Potassium 4.3 3.5 - 5.2 mmol/L   Chloride 102 96 - 106 mmol/L   CO2 21 20 - 29 mmol/L   Calcium 9.4 8.7 - 10.3 mg/dL   Total Protein 7.0 6.0 - 8.5 g/dL   Albumin 4.6 3.7 - 4.7 g/dL   Globulin, Total 2.4 1.5 - 4.5 g/dL   Albumin/Globulin Ratio 1.9 1.2 - 2.2   Bilirubin Total 0.4 0.0 - 1.2 mg/dL   Alkaline Phosphatase 63 39 - 117 IU/L   AST 30 0 - 40 IU/L   ALT 19 0 - 32 IU/L  Lipid panel  Result Value Ref Range   Cholesterol, Total 172 100 - 199 mg/dL   Triglycerides 110 0 - 149 mg/dL   HDL 62 >39 mg/dL   VLDL Cholesterol Cal 22 5 - 40 mg/dL   LDL Calculated 88 0 - 99 mg/dL   Chol/HDL Ratio 2.8 0.0 - 4.4 ratio  TSH  Result Value Ref Range   TSH 0.990 0.450 - 4.500 uIU/mL  CBC with Differential/Platelet  Result Value Ref Range   WBC 5.9 3.4 - 10.8 x10E3/uL   RBC 5.04 3.77 - 5.28 x10E6/uL   Hemoglobin 14.8 11.1 - 15.9 g/dL   Hematocrit 44.0 34.0 - 46.6 %   MCV 87 79 - 97 fL   MCH 29.4 26.6 - 33.0 pg   MCHC 33.6 31.5 - 35.7 g/dL   RDW 13.2 11.7 - 15.4 %   Platelets 315 150 - 450 x10E3/uL   Neutrophils 39 Not Estab. %   Lymphs 51 Not Estab. %   Monocytes 9 Not Estab. %   Eos 1 Not Estab. %   Basos 0 Not Estab. %  Neutrophils Absolute 2.3 1.4 - 7.0 x10E3/uL   Lymphocytes Absolute 2.9 0.7 - 3.1 x10E3/uL   Monocytes Absolute 0.6 0.1 - 0.9 x10E3/uL   EOS (ABSOLUTE) 0.1 0.0 - 0.4 x10E3/uL   Basophils Absolute 0.0 0.0 - 0.2  x10E3/uL   Immature Granulocytes 0 Not Estab. %   Immature Grans (Abs) 0.0 0.0 - 0.1 x10E3/uL      Assessment & Plan:   1. Well adult exam - CMP14+EGFR - Lipid panel - TSH - CBC with Differential/Platelet  2. Essential hypertension - amLODipine (NORVASC) 5 MG tablet; Take 1 tablet (5 mg total) by mouth daily.  Dispense: 90 tablet; Refill: 3 - lisinopril (ZESTRIL) 10 MG tablet; Take 1 tablet (10 mg total) by mouth daily.  Dispense: 90 tablet; Refill: 3 - CMP14+EGFR - Lipid panel - TSH - CBC with Differential/Platelet  3. Elevated lipoprotein(a) - pravastatin (PRAVACHOL) 80 MG tablet; Take 1 tablet (80 mg total) by mouth daily.  Dispense: 90 tablet; Refill: 3 - CMP14+EGFR - Lipid panel - TSH - CBC with Differential/Platelet  4. Hypothyroidism, unspecified type - SYNTHROID 125 MCG tablet; Take 1 tablet (125 mcg total) by mouth daily before breakfast.  Dispense: 90 tablet; Refill: 3 - CMP14+EGFR - Lipid panel - TSH - CBC with Differential/Platelet   Continue all other maintenance medications as listed above.  Follow up plan: No follow-ups on file.  Educational handout given for Jane Lew PA-C [place] [address]  [place], [place] [address] [phone]   06/18/2019, 8:49 PM

## 2019-07-05 ENCOUNTER — Ambulatory Visit: Payer: Medicare Other

## 2019-07-12 ENCOUNTER — Ambulatory Visit (INDEPENDENT_AMBULATORY_CARE_PROVIDER_SITE_OTHER): Payer: Medicare Other | Admitting: *Deleted

## 2019-07-12 ENCOUNTER — Encounter: Payer: Self-pay | Admitting: *Deleted

## 2019-07-12 VITALS — Ht 62.0 in | Wt 210.8 lb

## 2019-07-12 DIAGNOSIS — Z0000 Encounter for general adult medical examination without abnormal findings: Secondary | ICD-10-CM

## 2019-07-12 NOTE — Progress Notes (Signed)
MEDICARE ANNUAL WELLNESS VISIT  07/12/2019  Telephone Visit Disclaimer This Medicare AWV was conducted by telephone due to national recommendations for restrictions regarding the COVID-19 Pandemic (e.g. social distancing).  I verified, using two identifiers, that I am speaking with Chelsea Finley or their authorized healthcare agent. I discussed the limitations, risks, security, and privacy concerns of performing an evaluation and management service by telephone and the potential availability of an in-person appointment in the future. The patient expressed understanding and agreed to proceed.   Subjective:  Chelsea Finley is a 78 y.o. female patient of [name], Angel S, PA-C who had a Medicare Annual Wellness Visit today via telephone. Chelsea Finley is Retired and lives with their spouse. she has 4 children. she reports that she is socially active and does interact with friends/family regularly. she is not physically active and enjoys puzzles.  Patient Care Team: [name], Angel S, PA-C as PCP - General (Physician Assistant)  Advanced Directives 07/12/2019 10/11/2014 02/01/2012 01/25/2012 11/16/2011 11/10/2011  Does Patient Have a Medical Advance Directive? Yes No - Patient has advance directive, copy not in chart Patient does not have advance directive;Patient would not like information Patient does not have advance directive  Type of Advance Directive Living will;Healthcare Power of Attorney - - Living will - -  Does patient want to make changes to medical advance directive? No - Patient declined - - - - -  Copy of Healthcare Power of Attorney in Chart? No - copy requested - - - - -  Pre-existing out of facility DNR order (yellow form or pink MOST form) - - No - No No    Hospital Utilization Over the Past 12 Months: # of hospitalizations or ER visits: 0 # of surgeries: 0  Review of Systems    Patient reports that her overall health is unchanged compared to last year.  Patient Reported Readings  (BP, Pulse, CBG, Weight, etc) none  Review of Systems: History obtained from chart review and the patient General ROS: negative Musculoskeletal ROS: positive for - pain in shoulder - left  All other systems negative.  Pain Assessment Pain : 0-10 Pain Score: 5  Pain Type: Chronic pain Pain Location: Shoulder Pain Orientation: Left Pain Descriptors / Indicators: Aching, Sharp Pain Onset: More than a month ago Pain Frequency: Constant     Current Medications & Allergies (verified) Allergies as of 07/12/2019      Reactions   Morphine And Related Nausea And Vomiting      Medication List       Accurate as of July 12, 2019  3:34 PM. If you have any questions, ask your nurse or doctor.        amLODipine 5 MG tablet Commonly known as: NORVASC Take 1 tablet (5 mg total) by mouth daily.   doxycycline 100 MG tablet Commonly known as: VIBRA-TABS Take 1 tablet (100 mg total) by mouth 2 (two) times daily. 1 po bid   lisinopril 10 MG tablet Commonly known as: ZESTRIL Take 1 tablet (10 mg total) by mouth daily.   pravastatin 80 MG tablet Commonly known as: PRAVACHOL Take 1 tablet (80 mg total) by mouth daily.   Synthroid 125 MCG tablet Generic drug: levothyroxine Take 1 tablet (125 mcg total) by mouth daily before breakfast.       History (reviewed): Past Medical History:  Diagnosis Date  . Arthritis    OSTEO ARTHRITIS  . Hyperlipidemia   . Hypertension   . Thyroid disease  Past Surgical History:  Procedure Laterality Date  . APPENDECTOMY    . KNEE ARTHROPLASTY  11/16/2011   Procedure: COMPUTER ASSISTED TOTAL KNEE ARTHROPLASTY;  Surgeon: Kathryne Hitchhristopher Y Blackman;  Location: [facility] OR;  Service: Orthopedics;  Laterality: Right;  Right total knee arthroplasty  . KNEE ARTHROPLASTY  02/01/2012   Procedure: COMPUTER ASSISTED TOTAL KNEE ARTHROPLASTY;  Surgeon: Kathryne Hitchhristopher Y Blackman, MD;  [facility];  Service: Orthopedics;  Laterality: Left;  Left total knee  arthroplasty  . ROTATOR CUFF REPAIR  05/2010  . TUBAL LIGATION     Family History  Problem Relation Age of Onset  . Depression Mother   . Cancer Father    Social History   Socioeconomic History  . Marital status: Married    Spouse name: Chelsea Finley   . Number of children: 4  . Years of education: 1212  . Highest education level: 12th grade  Occupational History  . Occupation: Retired   Engineer, productionocial Needs  . Financial resource strain: Not hard at all  . Food insecurity    Worry: Never true    Inability: Never true  . Transportation needs    Medical: No    Non-medical: No  Tobacco Use  . Smoking status: Never Smoker  . Smokeless tobacco: Never Used  Substance and Sexual Activity  . Alcohol use: No  . Drug use: No  . Sexual activity: Not Currently  Lifestyle  . Physical activity    Days per week: 0 days    Minutes per session: 0 min  . Stress: Not at all  Relationships  . Social connections    Talks on phone: More than three times a week    Gets together: More than three times a week    Attends religious service: More than 4 times per year    Active member of club or organization: Yes    Attends meetings of clubs or organizations: More than 4 times per year    Relationship status: Married  Other Topics Concern  . Not on file  Social History Narrative  . Not on file    Activities of Daily Living In your present state of health, do you have any difficulty performing the following activities: 07/12/2019  Hearing? N  Vision? Y  Comment Has eye injections done yearly  Difficulty concentrating or making decisions? N  Walking or climbing stairs? N  Dressing or bathing? N  Doing errands, shopping? N  Preparing Food and eating ? N  Using the Toilet? N  In the past six months, have you accidently leaked urine? N  Do you have problems with loss of bowel control? N  Managing your Medications? N  Managing your Finances? N  Housekeeping or managing your Housekeeping? N  Some  recent data might be hidden    Patient Education/ Literacy How often do you need to have someone help you when you read instructions, pamphlets, or other written materials from your doctor or pharmacy?: 1 - Never What is the last grade level you completed in school?: 12th grade  Exercise Current Exercise Habits: The patient does not participate in regular exercise at present, Exercise limited by: None identified  Diet Patient reports consuming 2 meals a day and 1 snack(s) a day Patient reports that her primary diet is: Regular Patient reports that she does have regular access to food.   Depression Screen PHQ 2/9 Scores 07/12/2019 06/15/2019  PHQ - 2 Score 0 0     Fall Risk Fall Risk  07/12/2019 06/15/2019 06/15/2019  Falls in the past year? 1 0 0  Number falls in past yr: 1 - -  Injury with Fall? 0 - -     Objective:  Lizbeth Bark seemed alert and oriented and she participated appropriately during our telephone visit.  Blood Pressure Weight BMI  BP Readings from Last 3 Encounters:  06/15/19 (!) 146/84  10/11/14 178/92  02/03/12 118/73   Wt Readings from Last 3 Encounters:  07/12/19 210 lb 12.2 oz (95.6 kg)  06/15/19 210 lb 12.8 oz (95.6 kg)  10/11/14 182 lb (82.6 kg)   BMI Readings from Last 1 Encounters:  07/12/19 38.55 kg/m    *Unable to obtain current vital signs, weight, and BMI due to telephone visit type  Hearing/Vision  . Emira did not seem to have difficulty with hearing/understanding during the telephone conversation . Reports that she has had a formal eye exam by an eye care professional within the past year . Reports that she has not had a formal hearing evaluation within the past year *Unable to fully assess hearing and vision during telephone visit type  Cognitive Function: 6CIT Screen 07/12/2019  What Year? 0 points  What month? 0 points  What time? 0 points  Count back from 20 0 points  Months in reverse 0 points  Repeat phrase 0 points  Total Score  0   (Normal:0-7, Significant for Dysfunction: >8)  Normal Cognitive Function Screening: Yes   Immunization & Health Maintenance Record Immunization History  Administered Date(s) Administered  . Influenza, High Dose Seasonal PF 10/06/2016, 08/23/2017, 09/06/2018  . Influenza,trivalent, recombinat, inj, PF 09/12/2014, 09/08/2015  . Pneumococcal Conjugate-13 05/03/2018  . Pneumococcal Polysaccharide-23 06/03/2015, 11/04/2016  . Tdap 09/05/2010  . Zoster 09/05/2014    Health Maintenance  Topic Date Due  . INFLUENZA VACCINE  07/07/2019  . TETANUS/TDAP  09/05/2020  . DEXA SCAN  Completed  . PNA vac Low Risk Adult  Completed       Assessment  This is a routine wellness examination for RINA ADNEY.  Health Maintenance: Due or Overdue Health Maintenance Due  Topic Date Due  . INFLUENZA VACCINE  07/07/2019    Lizbeth Bark does not need a referral for Community Assistance: Care Management:   no Social Work:    no Prescription Assistance:  no Nutrition/Diabetes Education:  no   Plan:  Personalized Goals Goals Addressed            This Visit's Progress   . Exercise 3x per week (30 min per time)       Try to exercise for at least 30 minutes, 3 times weekly      Personalized Health Maintenance & Screening Recommendations  Influenza vaccine  Lung Cancer Screening Recommended: no (Low Dose CT Chest recommended if Age 37-80 years, 30 pack-year currently smoking OR have quit w/in past 15 years) Hepatitis C Screening recommended: no HIV Screening recommended: no  Advanced Directives: Written information was not prepared per patient's request.  Referrals & Orders No orders of the defined types were placed in this encounter.   Follow-up Plan . Follow-up with Terald Sleeper, PA-C as planned    I have personally reviewed and noted the following in the patient's chart:   . Medical and social history . Use of alcohol, tobacco or illicit drugs  . Current  medications and supplements . Functional ability and status . Nutritional status . Physical activity . Advanced directives . List of other physicians . Hospitalizations,  surgeries, and ER visits in previous 12 months . Vitals . Screenings to include cognitive, depression, and falls . Referrals and appointments  In addition, I have reviewed and discussed with Chelsea GeroldMarie Y Einspahr certain preventive protocols, quality metrics, and best practice recommendations. A written personalized care plan for preventive services as well as general preventive health recommendations is available and can be mailed to the patient at her request.      Caryl BisChanda M Adasha Boehme, LPN  1/6/10968/05/2019

## 2019-07-12 NOTE — Patient Instructions (Signed)
Chelsea Finley , Thank you for taking time to come for your Medicare Wellness Visit. I appreciate your ongoing commitment to your health goals. Please review the following plan we discussed and let me know if I can assist you in the future.   These are the goals we discussed: Goals    . Exercise 3x per week (30 min per time)     Try to exercise for at least 30 minutes, 3 times weekly       This is a list of the screening recommended for you and due dates:  Health Maintenance  Topic Date Due  . Flu Shot  07/07/2019  . Tetanus Vaccine  09/05/2020  . DEXA scan (bone density measurement)  Completed  . Pneumonia vaccines  Completed    BMI for Adults  Body mass index (BMI) is a number that is calculated from a person's weight and height. BMI may help to estimate how much of a person's weight is composed of fat. BMI can help identify those who may be at higher risk for certain medical problems. How is BMI used with adults? BMI is used as a screening tool to identify possible weight problems. It is used to check whether a person is obese, overweight, healthy weight, or underweight. How is BMI calculated? BMI measures your weight and compares it to your height. This can be done either in [place] ([place] or metric measurements. Note that charts are available to help you find your BMI quickly and easily without having to do these calculations yourself. To calculate your BMI in English (U[place] measurements, your health care provider will: 1. Measure your weight in pounds (lb). 2. Multiply the number of pounds by 703. ? For example, for a person who weighs 180 lb, multiply that number by 703, which equals 126,540. 3. Measure your height in inches (in). Then multiply that number by itself to get a measurement called "inches squared." ? For example, for a person who is 70 in tall, the "inches squared" measurement is 70 in x 70 in, which equals 4900 inches squared. 4. Divide the total from Step 2  (number of lb x 703) by the total from Step 3 (inches squared): 126,540  4900 = 25.8. This is your BMI. To calculate your BMI in metric measurements, your health care provider will: 1. Measure your weight in kilograms (kg). 2. Measure your height in meters (m). Then multiply that number by itself to get a measurement called "meters squared." ? For example, for a person who is 1.75 m tall, the "meters squared" measurement is 1.75 m x 1.75 m, which is equal to 3.1 meters squared. 3. Divide the number of kilograms (your weight) by the meters squared number. In this example: 70  3.1 = 22.6. This is your BMI. How is BMI interpreted? To interpret your results, your health care provider will use BMI charts to identify whether you are underweight, normal weight, overweight, or obese. The following guidelines will be used:  Underweight: BMI less than 18.5.  Normal weight: BMI between 18.5 and 24.9.  Overweight: BMI between 25 and 29.9.  Obese: BMI of 30 and above. Please note:  Weight includes both fat and muscle, so someone with a muscular build, such as an athlete, may have a BMI that is higher than 24.9. In cases like these, BMI is not an accurate measure of body fat.  To determine if excess body fat is the cause of a BMI of 25 or higher, further assessments may  need to be done by a health care provider.  BMI is usually interpreted in the same way for men and women. Why is BMI a useful tool? BMI is useful in two ways:  Identifying a weight problem that may be related to a medical condition, or that may increase the risk for medical problems.  Promoting lifestyle and diet changes in order to reach a healthy weight. Summary  Body mass index (BMI) is a number that is calculated from a person's weight and height.  BMI may help to estimate how much of a person's weight is composed of fat. BMI can help identify those who may be at higher risk for certain medical problems.  BMI can be  measured using English measurements or metric measurements.  To interpret your results, your health care provider will use BMI charts to identify whether you are underweight, normal weight, overweight, or obese. This information is not intended to replace advice given to you by your health care provider. Make sure you discuss any questions you have with your health care provider. Document Released: 08/03/2004 Document Revised: 11/04/2017 Document Reviewed: 10/05/2017 Elsevier Patient Education  2020 [employer]

## 2019-07-13 ENCOUNTER — Encounter (INDEPENDENT_AMBULATORY_CARE_PROVIDER_SITE_OTHER): Payer: Medicare Other | Admitting: Ophthalmology

## 2019-07-13 ENCOUNTER — Other Ambulatory Visit: Payer: Self-pay

## 2019-07-13 DIAGNOSIS — H34831 Tributary (branch) retinal vein occlusion, right eye, with macular edema: Secondary | ICD-10-CM

## 2019-07-13 DIAGNOSIS — I10 Essential (primary) hypertension: Secondary | ICD-10-CM

## 2019-07-13 DIAGNOSIS — H35033 Hypertensive retinopathy, bilateral: Secondary | ICD-10-CM | POA: Diagnosis not present

## 2019-08-24 ENCOUNTER — Encounter (INDEPENDENT_AMBULATORY_CARE_PROVIDER_SITE_OTHER): Payer: Medicare Other | Admitting: Ophthalmology

## 2019-08-24 ENCOUNTER — Other Ambulatory Visit: Payer: Self-pay

## 2019-08-24 DIAGNOSIS — I10 Essential (primary) hypertension: Secondary | ICD-10-CM

## 2019-08-24 DIAGNOSIS — H34831 Tributary (branch) retinal vein occlusion, right eye, with macular edema: Secondary | ICD-10-CM | POA: Diagnosis not present

## 2019-08-24 DIAGNOSIS — H43813 Vitreous degeneration, bilateral: Secondary | ICD-10-CM

## 2019-08-24 DIAGNOSIS — H35033 Hypertensive retinopathy, bilateral: Secondary | ICD-10-CM

## 2019-09-05 ENCOUNTER — Other Ambulatory Visit: Payer: Self-pay

## 2019-09-06 ENCOUNTER — Ambulatory Visit (INDEPENDENT_AMBULATORY_CARE_PROVIDER_SITE_OTHER): Payer: Medicare Other | Admitting: *Deleted

## 2019-09-06 DIAGNOSIS — Z23 Encounter for immunization: Secondary | ICD-10-CM

## 2019-09-13 ENCOUNTER — Other Ambulatory Visit: Payer: Self-pay

## 2019-09-13 DIAGNOSIS — Z20822 Contact with and (suspected) exposure to covid-19: Secondary | ICD-10-CM

## 2019-09-15 LAB — NOVEL CORONAVIRUS, NAA: SARS-CoV-2, NAA: NOT DETECTED

## 2019-09-17 ENCOUNTER — Telehealth: Payer: Self-pay | Admitting: General Practice

## 2019-09-17 NOTE — Telephone Encounter (Signed)
Negative COVID results given. Patient results "NOT Detected." Caller expressed understanding. ° °

## 2019-10-12 ENCOUNTER — Encounter (INDEPENDENT_AMBULATORY_CARE_PROVIDER_SITE_OTHER): Payer: Medicare Other | Admitting: Ophthalmology

## 2019-10-12 DIAGNOSIS — H35033 Hypertensive retinopathy, bilateral: Secondary | ICD-10-CM

## 2019-10-12 DIAGNOSIS — H43813 Vitreous degeneration, bilateral: Secondary | ICD-10-CM

## 2019-10-12 DIAGNOSIS — H34831 Tributary (branch) retinal vein occlusion, right eye, with macular edema: Secondary | ICD-10-CM

## 2019-10-12 DIAGNOSIS — H353112 Nonexudative age-related macular degeneration, right eye, intermediate dry stage: Secondary | ICD-10-CM

## 2019-10-12 DIAGNOSIS — I10 Essential (primary) hypertension: Secondary | ICD-10-CM

## 2019-10-12 DIAGNOSIS — H353121 Nonexudative age-related macular degeneration, left eye, early dry stage: Secondary | ICD-10-CM | POA: Diagnosis not present

## 2019-12-12 ENCOUNTER — Other Ambulatory Visit: Payer: Self-pay

## 2019-12-12 ENCOUNTER — Encounter: Payer: Self-pay | Admitting: Physician Assistant

## 2019-12-12 ENCOUNTER — Ambulatory Visit: Payer: Medicare PPO | Admitting: Physician Assistant

## 2019-12-12 VITALS — BP 133/73 | HR 74 | Temp 97.0°F | Ht 62.0 in | Wt 208.8 lb

## 2019-12-12 DIAGNOSIS — I10 Essential (primary) hypertension: Secondary | ICD-10-CM | POA: Diagnosis not present

## 2019-12-12 DIAGNOSIS — E7841 Elevated Lipoprotein(a): Secondary | ICD-10-CM | POA: Diagnosis not present

## 2019-12-12 DIAGNOSIS — E039 Hypothyroidism, unspecified: Secondary | ICD-10-CM

## 2019-12-12 MED ORDER — MUPIROCIN CALCIUM 2 % EX CREA: 1.0000 "application " | TOPICAL_CREAM | Freq: Two times a day (BID) | CUTANEOUS | 0 refills | Status: DC

## 2019-12-13 LAB — CMP14+EGFR
ALT: 19 IU/L (ref 0–32)
AST: 27 IU/L (ref 0–40)
Albumin/Globulin Ratio: 1.6 (ref 1.2–2.2)
Albumin: 4.4 g/dL (ref 3.7–4.7)
Alkaline Phosphatase: 56 IU/L (ref 39–117)
BUN/Creatinine Ratio: 17 (ref 12–28)
BUN: 13 mg/dL (ref 8–27)
Bilirubin Total: 0.4 mg/dL (ref 0.0–1.2)
CO2: 23 mmol/L (ref 20–29)
Calcium: 9.6 mg/dL (ref 8.7–10.3)
Chloride: 102 mmol/L (ref 96–106)
Creatinine, Ser: 0.75 mg/dL (ref 0.57–1.00)
GFR calc Af Amer: 88 mL/min/{1.73_m2} (ref >59)
GFR calc non Af Amer: 76 mL/min/{1.73_m2} (ref >59)
Globulin, Total: 2.8 g/dL (ref 1.5–4.5)
Glucose: 105 mg/dL — ABNORMAL HIGH (ref 65–99)
Potassium: 4.4 mmol/L (ref 3.5–5.2)
Sodium: 138 mmol/L (ref 134–144)
Total Protein: 7.2 g/dL (ref 6.0–8.5)

## 2019-12-13 LAB — CBC WITH DIFFERENTIAL/PLATELET
Basophils Absolute: 0 10*3/uL (ref 0.0–0.2)
Basos: 0 %
EOS (ABSOLUTE): 0.2 10*3/uL (ref 0.0–0.4)
Eos: 3 %
Hematocrit: 42.9 % (ref 34.0–46.6)
Hemoglobin: 14.4 g/dL (ref 11.1–15.9)
Immature Grans (Abs): 0 10*3/uL (ref 0.0–0.1)
Immature Granulocytes: 0 %
Lymphocytes Absolute: 2.6 10*3/uL (ref 0.7–3.1)
Lymphs: 47 %
MCH: 30.8 pg (ref 26.6–33.0)
MCHC: 33.6 g/dL (ref 31.5–35.7)
MCV: 92 fL (ref 79–97)
Monocytes Absolute: 0.6 10*3/uL (ref 0.1–0.9)
Monocytes: 11 %
Neutrophils Absolute: 2.2 10*3/uL (ref 1.4–7.0)
Neutrophils: 39 %
Platelets: 342 10*3/uL (ref 150–450)
RBC: 4.68 x10E6/uL (ref 3.77–5.28)
RDW: 12.6 % (ref 11.7–15.4)
WBC: 5.7 10*3/uL (ref 3.4–10.8)

## 2019-12-13 LAB — LIPID PANEL
Chol/HDL Ratio: 2.6 ratio (ref 0.0–4.4)
Cholesterol, Total: 157 mg/dL (ref 100–199)
HDL: 61 mg/dL (ref >39)
LDL Chol Calc (NIH): 81 mg/dL (ref 0–99)
Triglycerides: 80 mg/dL (ref 0–149)
VLDL Cholesterol Cal: 15 mg/dL (ref 5–40)

## 2019-12-13 LAB — TSH: TSH: 0.466 u[IU]/mL (ref 0.450–4.500)

## 2019-12-14 ENCOUNTER — Encounter (INDEPENDENT_AMBULATORY_CARE_PROVIDER_SITE_OTHER): Payer: Medicare PPO | Admitting: Ophthalmology

## 2019-12-14 ENCOUNTER — Encounter (INDEPENDENT_AMBULATORY_CARE_PROVIDER_SITE_OTHER): Payer: Medicare Other | Admitting: Ophthalmology

## 2019-12-14 DIAGNOSIS — H34831 Tributary (branch) retinal vein occlusion, right eye, with macular edema: Secondary | ICD-10-CM | POA: Diagnosis not present

## 2019-12-14 DIAGNOSIS — I10 Essential (primary) hypertension: Secondary | ICD-10-CM

## 2019-12-14 DIAGNOSIS — H353112 Nonexudative age-related macular degeneration, right eye, intermediate dry stage: Secondary | ICD-10-CM | POA: Diagnosis not present

## 2019-12-14 DIAGNOSIS — H43813 Vitreous degeneration, bilateral: Secondary | ICD-10-CM | POA: Diagnosis not present

## 2019-12-14 DIAGNOSIS — H35033 Hypertensive retinopathy, bilateral: Secondary | ICD-10-CM | POA: Diagnosis not present

## 2019-12-16 ENCOUNTER — Encounter: Payer: Self-pay | Admitting: Physician Assistant

## 2019-12-16 DIAGNOSIS — E7841 Elevated Lipoprotein(a): Secondary | ICD-10-CM

## 2019-12-16 DIAGNOSIS — E039 Hypothyroidism, unspecified: Secondary | ICD-10-CM | POA: Insufficient documentation

## 2019-12-16 HISTORY — DX: Elevated lipoprotein(a): E78.41

## 2019-12-16 NOTE — Progress Notes (Signed)
Acute Office Visit  Subjective:    Patient ID: Chelsea Finley, female    DOB: [birth year], 79 y.o.   MRN: [id number]  Chief Complaint  Patient presents with  . Medical Management of Chronic Issues    3 month follow up   . Hypertension  . Hypothyroidism    Hypertension This is a chronic problem. The current episode started more than 1 year ago. The problem is unchanged. The problem is controlled. Pertinent negatives include no chest pain. There are no associated agents to hypertension. Risk factors for coronary artery disease include family history. Past treatments include ACE inhibitors and calcium channel blockers. The current treatment provides significant improvement. There are no compliance problems.  Identifiable causes of hypertension include a thyroid problem.  Thyroid Problem Presents for follow-up visit. Symptoms include cold intolerance and fatigue. Patient reports no constipation, depressed mood, hair loss or heat intolerance. Her past medical history is significant for hyperlipidemia.  Hyperlipidemia This is a chronic problem. The current episode started more than 1 year ago. The problem is controlled. Recent lipid tests were reviewed and are normal. Exacerbating diseases include hypothyroidism. Pertinent negatives include no chest pain. Current antihyperlipidemic treatment includes statins. The current treatment provides significant improvement of lipids. There are no compliance problems.      Past Medical History:  Diagnosis Date  . Hyperlipidemia   . Hypertension     Past Surgical History:  Procedure Laterality Date  . APPENDECTOMY    . KNEE ARTHROPLASTY  11/16/2011   Procedure: COMPUTER ASSISTED TOTAL KNEE ARTHROPLASTY;  Surgeon: Mcarthur Rossetti;  Location: [facility];  Service: Orthopedics;  Laterality: Right;  Right total knee arthroplasty  . KNEE ARTHROPLASTY  02/01/2012   Procedure: COMPUTER ASSISTED TOTAL KNEE ARTHROPLASTY;  Surgeon: Mcarthur Rossetti,  MD;  Location: [facility];  Service: Orthopedics;  Laterality: Left;  Left total knee arthroplasty  . ROTATOR CUFF REPAIR  05/2010  . TUBAL LIGATION      Family History  Problem Relation Age of Onset  . Depression Mother   . Cancer Father     Social History   Socioeconomic History  . Marital status: Married    Spouse name: Darnelle Maffucci   . Number of children: 4  . Years of education: 43  . Highest education level: 12th grade  Occupational History  . Occupation: Retired   Tobacco Use  . Smoking status: Never Smoker  . Smokeless tobacco: Never Used  Substance and Sexual Activity  . Alcohol use: No  . Drug use: No  . Sexual activity: Not Currently  Other Topics Concern  . Not on file  Social History Narrative  . Not on file   Social Determinants of Health   Financial Resource Strain: Low Risk   . Difficulty of Paying Living Expenses: Not hard at all  Food Insecurity: No Food Insecurity  . Worried About Charity fundraiser in the Last Year: Never true  . Ran Out of Food in the Last Year: Never true  Transportation Needs: No Transportation Needs  . Lack of Transportation (Medical): No  . Lack of Transportation (Non-Medical): No  Physical Activity: Inactive  . Days of Exercise per Week: 0 days  . Minutes of Exercise per Session: 0 min  Stress: No Stress Concern Present  . Feeling of Stress : Not at all  Social Connections: Not Isolated  . Frequency of Communication with Friends and Family: More than three times a week  . Frequency of Social Gatherings  with Friends and Family: More than three times a week  . Attends Religious Services: More than 4 times per year  . Active Member of Clubs or Organizations: Yes  . Attends Archivist Meetings: More than 4 times per year  . Marital Status: Married  Human resources officer Violence: Not At Risk  . Fear of Current or Ex-Partner: No  . Emotionally Abused: No  . Physically Abused: No  . Sexually Abused: No    Outpatient  Medications Prior to Visit  Medication Sig Dispense Refill  . amLODipine (NORVASC) 5 MG tablet Take 1 tablet (5 mg total) by mouth daily. 90 tablet 3  . brimonidine (ALPHAGAN) 0.2 % ophthalmic solution     . lisinopril (ZESTRIL) 10 MG tablet Take 1 tablet (10 mg total) by mouth daily. 90 tablet 3  . pravastatin (PRAVACHOL) 80 MG tablet Take 1 tablet (80 mg total) by mouth daily. 90 tablet 3  . SYNTHROID 125 MCG tablet Take 1 tablet (125 mcg total) by mouth daily before breakfast. 90 tablet 3  . doxycycline (VIBRA-TABS) 100 MG tablet Take 1 tablet (100 mg total) by mouth 2 (two) times daily. 1 po bid 20 tablet 0   No facility-administered medications prior to visit.    Allergies  Allergen Reactions  . Morphine And Related Nausea And Vomiting    Review of Systems  Constitutional: Positive for fatigue. Negative for activity change and fever.  HENT: Negative.   Eyes: Negative.   Respiratory: Negative.  Negative for cough.   Cardiovascular: Negative.  Negative for chest pain.  Gastrointestinal: Negative.  Negative for abdominal pain and constipation.  Endocrine: Positive for cold intolerance. Negative for heat intolerance.  Genitourinary: Negative.  Negative for dysuria.  Musculoskeletal: Negative.   Skin: Negative.   Neurological: Negative.        Objective:    Physical Exam Constitutional:      General: She is not in acute distress.    Appearance: Normal appearance. She is well-developed.  HENT:     Head: Normocephalic and atraumatic.  Cardiovascular:     Rate and Rhythm: Normal rate.  Pulmonary:     Effort: Pulmonary effort is normal.  Skin:    General: Skin is warm and dry.     Findings: No rash.  Neurological:     Mental Status: She is alert and oriented to person, place, and time.     Deep Tendon Reflexes: Reflexes are normal and symmetric.     BP 133/73   Pulse 74   Temp (!) 97 F (36.1 C) (Temporal)   Ht 5' 2"  (1.575 m)   Wt 208 lb 12.8 oz (94.7 kg)    SpO2 99%   BMI 38.19 kg/m  Wt Readings from Last 3 Encounters:  12/12/19 208 lb 12.8 oz (94.7 kg)  07/12/19 210 lb 12.2 oz (95.6 kg)  06/15/19 210 lb 12.8 oz (95.6 kg)    There are no preventive care reminders to display for this patient.  There are no preventive care reminders to display for this patient.   Lab Results  Component Value Date   TSH 0.466 12/12/2019   Lab Results  Component Value Date   WBC 5.7 12/12/2019   HGB 14.4 12/12/2019   HCT 42.9 12/12/2019   MCV 92 12/12/2019   PLT 342 12/12/2019   Lab Results  Component Value Date   NA 138 12/12/2019   K 4.4 12/12/2019   CO2 23 12/12/2019   GLUCOSE 105 (H) 12/12/2019  BUN 13 12/12/2019   CREATININE 0.75 12/12/2019   BILITOT 0.4 12/12/2019   ALKPHOS 56 12/12/2019   AST 27 12/12/2019   ALT 19 12/12/2019   PROT 7.2 12/12/2019   ALBUMIN 4.4 12/12/2019   CALCIUM 9.6 12/12/2019   ANIONGAP 11 10/11/2014   Lab Results  Component Value Date   CHOL 157 12/12/2019   Lab Results  Component Value Date   HDL 61 12/12/2019   Lab Results  Component Value Date   LDLCALC 81 12/12/2019   Lab Results  Component Value Date   TRIG 80 12/12/2019   Lab Results  Component Value Date   CHOLHDL 2.6 12/12/2019   No results found for: HGBA1C     Assessment & Plan:   Problem List Items Addressed This Visit      Cardiovascular and Mediastinum   Essential hypertension   Relevant Orders   CBC with Differential/Platelet (Completed)   CMP14+EGFR (Completed)     Endocrine   Hypothyroidism - Primary   Relevant Orders   Lipid Panel (Completed)   TSH (Completed)     Other   Elevated lipoprotein(a)   Relevant Orders   CBC with Differential/Platelet (Completed)   CMP14+EGFR (Completed)   Lipid Panel (Completed)   TSH (Completed)       Meds ordered this encounter  Medications  . mupirocin cream (BACTROBAN) 2 %    Sig: Apply 1 application topically 2 (two) times daily.    Dispense:  30 g    Refill:  0      Order Specific Question:   Supervising Provider    Answer:   Janora Norlander [[id number]]     Terald Sleeper, PA-C

## 2019-12-20 ENCOUNTER — Telehealth: Payer: Self-pay | Admitting: Physician Assistant

## 2019-12-20 MED ORDER — MUPIROCIN CALCIUM 2 % EX CREA: 1.0000 "application " | TOPICAL_CREAM | Freq: Two times a day (BID) | CUTANEOUS | 0 refills | Status: DC

## 2019-12-20 NOTE — Telephone Encounter (Signed)
Detailed message left that rx sent to pharmacy. ?

## 2020-02-01 ENCOUNTER — Other Ambulatory Visit: Payer: Self-pay

## 2020-02-01 ENCOUNTER — Encounter (INDEPENDENT_AMBULATORY_CARE_PROVIDER_SITE_OTHER): Payer: Medicare PPO | Admitting: Ophthalmology

## 2020-02-01 DIAGNOSIS — H35033 Hypertensive retinopathy, bilateral: Secondary | ICD-10-CM | POA: Diagnosis not present

## 2020-02-01 DIAGNOSIS — I10 Essential (primary) hypertension: Secondary | ICD-10-CM

## 2020-02-01 DIAGNOSIS — H353112 Nonexudative age-related macular degeneration, right eye, intermediate dry stage: Secondary | ICD-10-CM | POA: Diagnosis not present

## 2020-02-01 DIAGNOSIS — H34831 Tributary (branch) retinal vein occlusion, right eye, with macular edema: Secondary | ICD-10-CM

## 2020-02-01 DIAGNOSIS — H43813 Vitreous degeneration, bilateral: Secondary | ICD-10-CM

## 2020-03-28 ENCOUNTER — Encounter (INDEPENDENT_AMBULATORY_CARE_PROVIDER_SITE_OTHER): Payer: Medicare PPO | Admitting: Ophthalmology

## 2020-03-28 DIAGNOSIS — I10 Essential (primary) hypertension: Secondary | ICD-10-CM | POA: Diagnosis not present

## 2020-03-28 DIAGNOSIS — H35033 Hypertensive retinopathy, bilateral: Secondary | ICD-10-CM

## 2020-03-28 DIAGNOSIS — H43813 Vitreous degeneration, bilateral: Secondary | ICD-10-CM

## 2020-03-28 DIAGNOSIS — H34831 Tributary (branch) retinal vein occlusion, right eye, with macular edema: Secondary | ICD-10-CM | POA: Diagnosis not present

## 2020-03-28 DIAGNOSIS — H353112 Nonexudative age-related macular degeneration, right eye, intermediate dry stage: Secondary | ICD-10-CM | POA: Diagnosis not present

## 2020-04-07 ENCOUNTER — Encounter: Payer: Self-pay | Admitting: Nurse Practitioner

## 2020-04-07 ENCOUNTER — Telehealth: Payer: Self-pay | Admitting: Physician Assistant

## 2020-04-07 ENCOUNTER — Ambulatory Visit: Payer: Medicare PPO | Admitting: Nurse Practitioner

## 2020-04-07 ENCOUNTER — Other Ambulatory Visit: Payer: Self-pay

## 2020-04-07 VITALS — BP 137/82 | HR 86 | Temp 97.9°F | Resp 20 | Ht 62.0 in | Wt 208.0 lb

## 2020-04-07 DIAGNOSIS — H0101A Ulcerative blepharitis right eye, upper and lower eyelids: Secondary | ICD-10-CM | POA: Insufficient documentation

## 2020-04-07 MED ORDER — POLYMYXIN B-TRIMETHOPRIM 10000-0.1 UNIT/ML-% OP SOLN: 1.0000 [drp] | OPHTHALMIC | 0 refills | Status: DC

## 2020-04-07 MED ORDER — PREDNISONE 10 MG (21) PO TBPK: ORAL_TABLET | ORAL | 0 refills | Status: DC

## 2020-04-07 MED ORDER — OLOPATADINE HCL 0.1 % OP SOLN: 1.0000 [drp] | Freq: Two times a day (BID) | OPHTHALMIC | 1 refills | Status: DC

## 2020-04-07 NOTE — Assessment & Plan Note (Addendum)
Blepharitis is severe and  not well controlled, increase itching and burning. Education provided to patient to keep eye clean and avoid scratcting with fingers. Patient started on Pataday for itch, Polytrim ophthalmic drops and prednisone pack. Rx sent to pharmacy. Patient advised to follow up as needed with unresolved or worsening symptoms.

## 2020-04-07 NOTE — Progress Notes (Signed)
Established Patient Office Visit  Subjective:  Patient ID: Chelsea Finley, female    DOB: 1941/09/05  Age: 79 y.o. MRN: [id number]  CC:  Chief Complaint  Patient presents with  . eye irritation    lid / skin swollen and red / itch     HPI Chelsea Finley presents for Ulcerative blepharitis in right eye, problem started about a week ago with itching, clear drainage and burning. patient reports that this is not a new problem and it is not related to seasonal allergies, however she is experiencing increase dryness in both eyes and not only in the affected eye.  Nothing over the counter has helped to alleviate symptoms. Pain /burning is a 4/10. Chelsea Finley is currently sees an ophthalmologist for other chronic eye problem  Past Medical History:  Diagnosis Date  . Hyperlipidemia   . Hypertension     Past Surgical History:  Procedure Laterality Date  . APPENDECTOMY    . KNEE ARTHROPLASTY  11/16/2011   Procedure: COMPUTER ASSISTED TOTAL KNEE ARTHROPLASTY;  Surgeon: Kathryne Hitch;  Location: [facility];  Service: Orthopedics;  Laterality: Right;  Right total knee arthroplasty  . KNEE ARTHROPLASTY  02/01/2012   Procedure: COMPUTER ASSISTED TOTAL KNEE ARTHROPLASTY;  Surgeon: Kathryne Hitch, MD;  Location: [facility];  Service: Orthopedics;  Laterality: Left;  Left total knee arthroplasty  . ROTATOR CUFF REPAIR  05/2010  . TUBAL LIGATION      Family History  Problem Relation Age of Onset  . Depression Mother   . Cancer Father     Social History   Socioeconomic History  . Marital status: Married    Spouse name: Fabiola Backer   . Number of children: 4  . Years of education: 61  . Highest education level: 12th grade  Occupational History  . Occupation: Retired   Tobacco Use  . Smoking status: Never Smoker  . Smokeless tobacco: Never Used  Substance and Sexual Activity  . Alcohol use: No  . Drug use: No  . Sexual activity: Not Currently  Other Topics Concern  . Not on file    Social History Narrative  . Not on file   Social Determinants of Health   Financial Resource Strain: Low Risk   . Difficulty of Paying Living Expenses: Not hard at all  Food Insecurity: No Food Insecurity  . Worried About Programme researcher, broadcasting/film/video in the Last Year: Never true  . Ran Out of Food in the Last Year: Never true  Transportation Needs: No Transportation Needs  . Lack of Transportation (Medical): No  . Lack of Transportation (Non-Medical): No  Physical Activity: Inactive  . Days of Exercise per Week: 0 days  . Minutes of Exercise per Session: 0 min  Stress: No Stress Concern Present  . Feeling of Stress : Not at all  Social Connections: Not Isolated  . Frequency of Communication with Friends and Family: More than three times a week  . Frequency of Social Gatherings with Friends and Family: More than three times a week  . Attends Religious Services: More than 4 times per year  . Active Member of Clubs or Organizations: Yes  . Attends Banker Meetings: More than 4 times per year  . Marital Status: Married  Catering manager Violence: Not At Risk  . Fear of Current or Ex-Partner: No  . Emotionally Abused: No  . Physically Abused: No  . Sexually Abused: No    Outpatient Medications Prior to Visit  Medication Sig Dispense Refill  . amLODipine (NORVASC) 5 MG tablet Take 1 tablet (5 mg total) by mouth daily. 90 tablet 3  . brimonidine (ALPHAGAN) 0.2 % ophthalmic solution     . lisinopril (ZESTRIL) 10 MG tablet Take 1 tablet (10 mg total) by mouth daily. 90 tablet 3  . mupirocin cream (BACTROBAN) 2 % Apply 1 application topically 2 (two) times daily. 30 g 0  . pravastatin (PRAVACHOL) 80 MG tablet Take 1 tablet (80 mg total) by mouth daily. 90 tablet 3  . SYNTHROID 125 MCG tablet Take 1 tablet (125 mcg total) by mouth daily before breakfast. 90 tablet 3   No facility-administered medications prior to visit.    Allergies  Allergen Reactions  . Morphine And Related  Nausea And Vomiting    ROS Review of Systems  Constitutional: Negative for activity change.  HENT: Negative.   Eyes: Positive for pain, discharge, redness and itching. Negative for photophobia and visual disturbance.  Respiratory: Negative.   Cardiovascular: Negative.   Gastrointestinal: Negative.   Genitourinary: Negative.   Musculoskeletal: Negative.   Skin: Positive for color change.       Skin around eye (upper and lower eye lid) is red  Psychiatric/Behavioral: Negative.       Objective:    Physical Exam  Constitutional: She is oriented to person, place, and time. She appears well-developed and well-nourished.  HENT:  Head: Normocephalic.  Mouth/Throat: Oropharynx is clear and moist.  Eyes: Right eye exhibits no discharge.  Conjunctiva red, upper and lower eye lid is red  Cardiovascular: Normal rate, regular rhythm and normal heart sounds.  Pulmonary/Chest: Breath sounds normal.  Abdominal: Bowel sounds are normal.  Musculoskeletal:     Cervical back: Neck supple.  Neurological: She is alert and oriented to person, place, and time.  Skin: Skin is warm. There is erythema.  Psychiatric: She has a normal mood and affect. Her behavior is normal.    BP 137/82   Pulse 86   Temp 97.9 F (36.6 C)   Resp 20   Ht 5\' 2"  (1.575 m)   Wt 208 lb (94.3 kg)   SpO2 98%   BMI 38.04 kg/m  Wt Readings from Last 3 Encounters:  04/07/20 208 lb (94.3 kg)  12/12/19 208 lb 12.8 oz (94.7 kg)  07/12/19 210 lb 12.2 oz (95.6 kg)     Health Maintenance Due  Topic Date Due  . COVID-19 Vaccine (1) Never done    There are no preventive care reminders to display for this patient.  Lab Results  Component Value Date   TSH 0.466 12/12/2019   Lab Results  Component Value Date   WBC 5.7 12/12/2019   HGB 14.4 12/12/2019   HCT 42.9 12/12/2019   MCV 92 12/12/2019   PLT 342 12/12/2019   Lab Results  Component Value Date   NA 138 12/12/2019   K 4.4 12/12/2019   CO2 23 12/12/2019     GLUCOSE 105 (H) 12/12/2019   BUN 13 12/12/2019   CREATININE 0.75 12/12/2019   BILITOT 0.4 12/12/2019   ALKPHOS 56 12/12/2019   AST 27 12/12/2019   ALT 19 12/12/2019   PROT 7.2 12/12/2019   ALBUMIN 4.4 12/12/2019   CALCIUM 9.6 12/12/2019   ANIONGAP 11 10/11/2014   Lab Results  Component Value Date   CHOL 157 12/12/2019   Lab Results  Component Value Date   HDL 61 12/12/2019   Lab Results  Component Value Date   LDLCALC 81 12/12/2019  Lab Results  Component Value Date   TRIG 80 12/12/2019   Lab Results  Component Value Date   CHOLHDL 2.6 12/12/2019   No results found for: HGBA1C    Assessment & Plan:   Problem List Items Addressed This Visit      Other   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 Education provided to patient to keep eye clean and avoid scratcting with fingers. Patient started on Pataday for itch, Polytrim ophthalmic drops and prednisone pack. Rx sent to pharmacy. Patient advised to follow up as needed with unresolved or worsening symptoms.       Relevant Medications   trimethoprim-polymyxin b (POLYTRIM) ophthalmic solution   olopatadine (PATADAY) 0.1 % ophthalmic solution   predniSONE (STERAPRED UNI-PAK 21 TAB) 10 MG (21) TBPK tablet      Meds ordered this encounter  Medications  . trimethoprim-polymyxin b (POLYTRIM) ophthalmic solution    Sig: Place 1 drop into the right eye every 4 (four) hours.    Dispense:  10 mL    Refill:  0    Order Specific Question:   Supervising Provider    Answer:   Caryl Pina A [id number]  . olopatadine (PATADAY) 0.1 % ophthalmic solution    Sig: Place 1 drop into the right eye 2 (two) times daily.    Dispense:  5 mL    Refill:  1    Order Specific Question:   Supervising Provider    Answer:   Caryl Pina A [id number]  . predniSONE (STERAPRED UNI-PAK 21 TAB) 10 MG (21) TBPK tablet     Sig: 6 tablet by mouth day 1, 5 day 2, 4 day 3, 3 day 4, 2 day 4, 1 day 6    Dispense:  1 each    Refill:  0    Order Specific Question:   Supervising Provider    Answer:   Caryl Pina A [[id number]]    Follow-up: Return if symptoms worsen or fail to improve.    Ivy Lynn, NP

## 2020-04-07 NOTE — Telephone Encounter (Signed)
Appointment scheduled.

## 2020-04-07 NOTE — Telephone Encounter (Signed)
Attempted to contact - NA  NTBS to be evaluated - in office or urgent care

## 2020-04-07 NOTE — Patient Instructions (Addendum)
   Blepharitis is severe and  not well controlled, increase itching and burning. Education provided to patient to keep eye clean and avoid scratcting with fingers. Patient started on Pataday for itch, Polytrim and prednisone pack. Rx sent to pharmacy. Patient advised to follow up as needed with unresolved or worsening symptoms.     Blepharitis Blepharitis is swelling of the eyelids. Symptoms may include:  Reddish, scaly skin around the scalp and eyebrows.  Burning or itching of the eyelids.  Fluid coming from the eye at night. This causes the eyelashes to stick together in the morning.  Eyelashes that fall out.  Being sensitive to light. Follow these instructions at home: Pay attention to any changes in how you look or feel. Tell your health care provider about any changes. Follow these instructions to help with your condition: Keeping clean   Wash your hands often.  Wash your eyelids with warm water, or wash them with warm water that is mixed with little bit of baby shampoo. Do this 2 or more times per day.  Wash your face and eyebrows at least once a day.  Use a clean towel each time you dry your eyelids. Do not use the towel to clean or dry other areas of your body. Do not share your towel with anyone. General instructions  Avoid wearing makeup until you get better. Do not share makeup with anyone.  Avoid rubbing your eyes.  Put a warm compress on your eyes 2 times per day for 10 minutes at a time, or as told by your doctor.  If you were given antibiotics in the form of creams or eye drops, use the medicine as told by your doctor. Do not stop using the medicine even if you feel better.  Keep all follow-up visits as told by your doctor. This is important. Contact a doctor if:  Your eyelids feel hot.  You have blisters on your eyelids.  You have a rash on your eyelids.  The swelling does not go away in 2-4 days.  The swelling gets worse. Get help right away  if:  You have pain that gets worse.  You have pain that spreads to other parts of your face.  You have redness that gets worse.  You have redness that spreads to other parts of your face.  Your vision changes.  You have pain when you look at lights or things that move.  You have a fever. Summary  Blepharitis is swelling of the eyelids.  Pay attention to any changes in how your eyes look or feel. Tell your doctor about any changes.  Follow home care instructions as told by your doctor. Wash your hands often. Avoid wearing makeup. Do not rub your eyes.  Use warm compresses, creams, or eye drops as told by your doctor.  Let your doctor know if you have changes in vision, blisters or rash on eyelids, pain that spreads to your face, or warmth on your eyelids. This information is not intended to replace advice given to you by your health care provider. Make sure you discuss any questions you have with your health care provider. Document Revised: 05/22/2018 Document Reviewed: 05/22/2018 Elsevier Patient Education  2020 [employer].

## 2020-04-16 ENCOUNTER — Other Ambulatory Visit: Payer: Self-pay | Admitting: *Deleted

## 2020-04-16 DIAGNOSIS — E7841 Elevated Lipoprotein(a): Secondary | ICD-10-CM

## 2020-04-16 MED ORDER — PRAVASTATIN SODIUM 80 MG PO TABS: 80.0000 mg | ORAL_TABLET | Freq: Every day | ORAL | 0 refills | Status: DC

## 2020-04-22 DIAGNOSIS — H01111 Allergic dermatitis of right upper eyelid: Secondary | ICD-10-CM | POA: Diagnosis not present

## 2020-04-22 DIAGNOSIS — H10211 Acute toxic conjunctivitis, right eye: Secondary | ICD-10-CM | POA: Diagnosis not present

## 2020-04-30 ENCOUNTER — Other Ambulatory Visit: Payer: Self-pay | Admitting: *Deleted

## 2020-04-30 DIAGNOSIS — H40051 Ocular hypertension, right eye: Secondary | ICD-10-CM | POA: Diagnosis not present

## 2020-04-30 DIAGNOSIS — E039 Hypothyroidism, unspecified: Secondary | ICD-10-CM

## 2020-04-30 DIAGNOSIS — H10211 Acute toxic conjunctivitis, right eye: Secondary | ICD-10-CM | POA: Diagnosis not present

## 2020-04-30 DIAGNOSIS — H01111 Allergic dermatitis of right upper eyelid: Secondary | ICD-10-CM | POA: Diagnosis not present

## 2020-04-30 MED ORDER — SYNTHROID 125 MCG PO TABS: 125.0000 ug | ORAL_TABLET | Freq: Every day | ORAL | 0 refills | Status: DC

## 2020-05-13 DIAGNOSIS — H10211 Acute toxic conjunctivitis, right eye: Secondary | ICD-10-CM | POA: Diagnosis not present

## 2020-05-13 DIAGNOSIS — H40051 Ocular hypertension, right eye: Secondary | ICD-10-CM | POA: Diagnosis not present

## 2020-05-13 DIAGNOSIS — H01111 Allergic dermatitis of right upper eyelid: Secondary | ICD-10-CM | POA: Diagnosis not present

## 2020-05-16 ENCOUNTER — Other Ambulatory Visit: Payer: Self-pay

## 2020-05-16 ENCOUNTER — Encounter (INDEPENDENT_AMBULATORY_CARE_PROVIDER_SITE_OTHER): Payer: Medicare PPO | Admitting: Ophthalmology

## 2020-05-16 DIAGNOSIS — H34831 Tributary (branch) retinal vein occlusion, right eye, with macular edema: Secondary | ICD-10-CM

## 2020-05-16 DIAGNOSIS — H43813 Vitreous degeneration, bilateral: Secondary | ICD-10-CM

## 2020-05-16 DIAGNOSIS — I10 Essential (primary) hypertension: Secondary | ICD-10-CM

## 2020-05-16 DIAGNOSIS — H35033 Hypertensive retinopathy, bilateral: Secondary | ICD-10-CM

## 2020-05-29 ENCOUNTER — Other Ambulatory Visit: Payer: Self-pay | Admitting: *Deleted

## 2020-05-29 DIAGNOSIS — I10 Essential (primary) hypertension: Secondary | ICD-10-CM

## 2020-05-29 MED ORDER — AMLODIPINE BESYLATE 5 MG PO TABS: 5.0000 mg | ORAL_TABLET | Freq: Every day | ORAL | 0 refills | Status: DC

## 2020-05-29 MED ORDER — LISINOPRIL 10 MG PO TABS: 10.0000 mg | ORAL_TABLET | Freq: Every day | ORAL | 0 refills | Status: DC

## 2020-06-24 ENCOUNTER — Other Ambulatory Visit: Payer: Self-pay

## 2020-06-24 ENCOUNTER — Encounter: Payer: Self-pay | Admitting: Family Medicine

## 2020-06-24 ENCOUNTER — Ambulatory Visit: Payer: Medicare PPO | Admitting: Physician Assistant

## 2020-06-24 ENCOUNTER — Ambulatory Visit: Payer: Medicare PPO | Admitting: Family Medicine

## 2020-06-24 VITALS — BP 136/80 | HR 83 | Temp 97.8°F | Resp 20 | Ht 62.0 in | Wt 206.2 lb

## 2020-06-24 DIAGNOSIS — M19019 Primary osteoarthritis, unspecified shoulder: Secondary | ICD-10-CM | POA: Diagnosis not present

## 2020-06-24 DIAGNOSIS — E039 Hypothyroidism, unspecified: Secondary | ICD-10-CM | POA: Diagnosis not present

## 2020-06-24 DIAGNOSIS — I10 Essential (primary) hypertension: Secondary | ICD-10-CM

## 2020-06-24 DIAGNOSIS — E7841 Elevated Lipoprotein(a): Secondary | ICD-10-CM | POA: Diagnosis not present

## 2020-06-24 MED ORDER — LISINOPRIL 10 MG PO TABS: 10.0000 mg | ORAL_TABLET | Freq: Every day | ORAL | 1 refills | Status: DC

## 2020-06-24 MED ORDER — PRAVASTATIN SODIUM 80 MG PO TABS: 80.0000 mg | ORAL_TABLET | Freq: Every day | ORAL | 1 refills | Status: DC

## 2020-06-24 MED ORDER — AMLODIPINE BESYLATE 5 MG PO TABS: 5.0000 mg | ORAL_TABLET | Freq: Every day | ORAL | 1 refills | Status: DC

## 2020-06-24 MED ORDER — SYNTHROID 125 MCG PO TABS: 125.0000 ug | ORAL_TABLET | Freq: Every day | ORAL | 1 refills | Status: DC

## 2020-06-24 NOTE — Progress Notes (Signed)
Subjective:  Patient ID: Chelsea Finley, female    DOB: Aug 30, 1941  Age: 79 y.o. MRN: [id number]  CC: Medical Management of Chronic Issues   HPI Chelsea Finley presents for  follow-up of hypertension. Patient has no history of headache chest pain or shortness of breath or recent cough. Patient also denies symptoms of TIA such as focal numbness or weakness. Patient denies side effects from medication. States taking it regularly.  follow-up on  thyroid. The patient has a history of hypothyroidism for many years. It has been stable recently. Pt. denies any change in  voice, loss of hair, heat or cold intolerance. Energy level has been adequate to good. Patient denies constipation and diarrhea. No myxedema. Medication is as noted below. Verified that pt is taking it daily on an empty stomach. Well tolerated.  in for follow-up of elevated cholesterol. Doing well without complaints on current medication. Denies side effects of statin including myalgia and arthralgia and nausea. Currently no chest pain, shortness of breath or other cardiovascular related symptoms noted.   History Chelsea Finley has a past medical history of Hyperlipidemia and Hypertension.   She has a past surgical history that includes Rotator cuff repair (05/2010); Knee Arthroplasty (11/16/2011); Appendectomy; Tubal ligation; and Knee Arthroplasty (02/01/2012).   Her family history includes Cancer in her father; Depression in her mother.She reports that she has never smoked. She has never used smokeless tobacco. She reports that she does not drink alcohol and does not use drugs.  Current Outpatient Medications on File Prior to Visit  Medication Sig Dispense Refill  . dorzolamide (TRUSOPT) 2 % ophthalmic solution     . mupirocin cream (BACTROBAN) 2 % Apply 1 application topically 2 (two) times daily. 30 g 0   No current facility-administered medications on file prior to visit.    ROS Review of Systems  Constitutional: Negative.     HENT: Negative for congestion.   Eyes: Negative for visual disturbance.  Respiratory: Negative for shortness of breath.   Cardiovascular: Negative for chest pain.  Gastrointestinal: Negative for abdominal pain, constipation, diarrhea, nausea and vomiting.  Genitourinary: Negative for difficulty urinating.  Musculoskeletal: Positive for myalgias (cramps at night, legs, left neck. Using magnesium with adequate relief). Negative for arthralgias.  Neurological: Negative for headaches.  Psychiatric/Behavioral: Negative for sleep disturbance.    Objective:  BP 136/80   Pulse 83   Temp 97.8 F (36.6 C) (Temporal)   Resp 20   Ht _0  (1.575 m)   Wt 206 lb 4 oz (93.6 kg)   SpO2 99%   BMI 37.72 kg/m   BP Readings from Last 3 Encounters:  06/24/20 136/80  04/07/20 137/82  12/12/19 133/73    Wt Readings from Last 3 Encounters:  06/24/20 206 lb 4 oz (93.6 kg)  04/07/20 208 lb (94.3 kg)  12/12/19 208 lb 12.8 oz (94.7 kg)     Physical Exam Constitutional:      General: She is not in acute distress.    Appearance: She is well-developed.  HENT:     Head: Normocephalic and atraumatic.  Eyes:     Conjunctiva/sclera: Conjunctivae normal.     Pupils: Pupils are equal, round, and reactive to light.  Neck:     Thyroid: No thyromegaly.  Cardiovascular:     Rate and Rhythm: Normal rate and regular rhythm.     Heart sounds: Normal heart sounds. No murmur heard.   Pulmonary:     Effort: Pulmonary effort is normal. No respiratory distress.  Breath sounds: Normal breath sounds. No wheezing or rales.  Abdominal:     General: Bowel sounds are normal. There is no distension.     Palpations: Abdomen is soft.     Tenderness: There is no abdominal tenderness.  Musculoskeletal:        General: Normal range of motion.     Cervical back: Normal range of motion and neck supple.  Lymphadenopathy:     Cervical: No cervical adenopathy.  Skin:    General: Skin is warm and dry.   Neurological:     Mental Status: She is alert and oriented to person, place, and time.  Psychiatric:        Behavior: Behavior normal.        Thought Content: Thought content normal.        Judgment: Judgment normal.       Assessment & Plan:   Chelsea Finley was seen today for medical management of chronic issues.  Diagnoses and all orders for this visit:  Hypothyroidism, unspecified type -     SYNTHROID 125 MCG tablet; Take 1 tablet (125 mcg total) by mouth daily before breakfast. (will need labs in July) -     CBC with Differential/Platelet -     CMP14+EGFR -     TSH + free T4  Essential hypertension -     amLODipine (NORVASC) 5 MG tablet; Take 1 tablet (5 mg total) by mouth daily. -     lisinopril (ZESTRIL) 10 MG tablet; Take 1 tablet (10 mg total) by mouth daily. -     CBC with Differential/Platelet -     CMP14+EGFR  Elevated lipoprotein(a) -     pravastatin (PRAVACHOL) 80 MG tablet; Take 1 tablet (80 mg total) by mouth daily. -     CBC with Differential/Platelet -     CMP14+EGFR -     Lipid panel  Arthritis of shoulder   Allergies as of 06/24/2020      Reactions   Morphine And Related Nausea And Vomiting      Medication List       Accurate as of June 24, 2020  8:52 AM. If you have any questions, ask your nurse or doctor.        STOP taking these medications   brimonidine 0.2 % ophthalmic solution Commonly known as: ALPHAGAN Stopped by: Claretta Fraise, MD   olopatadine 0.1 % ophthalmic solution Commonly known as: Pataday Stopped by: Claretta Fraise, MD   predniSONE 10 MG (21) Tbpk tablet Commonly known as: STERAPRED UNI-PAK 21 TAB Stopped by: Claretta Fraise, MD   trimethoprim-polymyxin b ophthalmic solution Commonly known as: Polytrim Stopped by: Claretta Fraise, MD     TAKE these medications   amLODipine 5 MG tablet Commonly known as: NORVASC Take 1 tablet (5 mg total) by mouth daily.   dorzolamide 2 % ophthalmic solution Commonly known as: TRUSOPT    lisinopril 10 MG tablet Commonly known as: ZESTRIL Take 1 tablet (10 mg total) by mouth daily.   mupirocin cream 2 % Commonly known as: BACTROBAN Apply 1 application topically 2 (two) times daily.   pravastatin 80 MG tablet Commonly known as: PRAVACHOL Take 1 tablet (80 mg total) by mouth daily.   Synthroid 125 MCG tablet Generic drug: levothyroxine Take 1 tablet (125 mcg total) by mouth daily before breakfast. (will need labs in July)       Meds ordered this encounter  Medications  . amLODipine (NORVASC) 5 MG tablet    Sig: Take 1  tablet (5 mg total) by mouth daily.    Dispense:  90 tablet    Refill:  1  . lisinopril (ZESTRIL) 10 MG tablet    Sig: Take 1 tablet (10 mg total) by mouth daily.    Dispense:  90 tablet    Refill:  1  . pravastatin (PRAVACHOL) 80 MG tablet    Sig: Take 1 tablet (80 mg total) by mouth daily.    Dispense:  90 tablet    Refill:  1  . SYNTHROID 125 MCG tablet    Sig: Take 1 tablet (125 mcg total) by mouth daily before breakfast. (will need labs in July)    Dispense:  90 tablet    Refill:  1      Follow-up: Return in about 6 months (around 12/25/2020) for hypertension, Hypothyroidism, cholesterol.  Claretta Fraise, M.D.

## 2020-06-25 LAB — TSH+FREE T4
Free T4: 1.96 ng/dL — ABNORMAL HIGH (ref 0.82–1.77)
TSH: 0.641 u[IU]/mL (ref 0.450–4.500)

## 2020-06-25 LAB — LIPID PANEL
Chol/HDL Ratio: 2.7 ratio (ref 0.0–4.4)
Cholesterol, Total: 171 mg/dL (ref 100–199)
HDL: 63 mg/dL (ref >39)
LDL Chol Calc (NIH): 94 mg/dL (ref 0–99)
Triglycerides: 73 mg/dL (ref 0–149)
VLDL Cholesterol Cal: 14 mg/dL (ref 5–40)

## 2020-06-25 LAB — CMP14+EGFR
ALT: 20 IU/L (ref 0–32)
AST: 26 IU/L (ref 0–40)
Albumin/Globulin Ratio: 2 (ref 1.2–2.2)
Albumin: 4.8 g/dL — ABNORMAL HIGH (ref 3.7–4.7)
Alkaline Phosphatase: 68 IU/L (ref 48–121)
BUN/Creatinine Ratio: 17 (ref 12–28)
BUN: 16 mg/dL (ref 8–27)
Bilirubin Total: 0.4 mg/dL (ref 0.0–1.2)
CO2: 23 mmol/L (ref 20–29)
Calcium: 10.2 mg/dL (ref 8.7–10.3)
Chloride: 101 mmol/L (ref 96–106)
Creatinine, Ser: 0.93 mg/dL (ref 0.57–1.00)
GFR calc Af Amer: 67 mL/min/{1.73_m2} (ref >59)
GFR calc non Af Amer: 58 mL/min/{1.73_m2} — ABNORMAL LOW (ref >59)
Globulin, Total: 2.4 g/dL (ref 1.5–4.5)
Glucose: 102 mg/dL — ABNORMAL HIGH (ref 65–99)
Potassium: 4.3 mmol/L (ref 3.5–5.2)
Sodium: 139 mmol/L (ref 134–144)
Total Protein: 7.2 g/dL (ref 6.0–8.5)

## 2020-06-25 LAB — CBC WITH DIFFERENTIAL/PLATELET
Basophils Absolute: 0 10*3/uL (ref 0.0–0.2)
Basos: 0 %
EOS (ABSOLUTE): 0.1 10*3/uL (ref 0.0–0.4)
Eos: 1 %
Hematocrit: 44.1 % (ref 34.0–46.6)
Hemoglobin: 14.7 g/dL (ref 11.1–15.9)
Immature Grans (Abs): 0 10*3/uL (ref 0.0–0.1)
Immature Granulocytes: 0 %
Lymphocytes Absolute: 2.4 10*3/uL (ref 0.7–3.1)
Lymphs: 43 %
MCH: 30.8 pg (ref 26.6–33.0)
MCHC: 33.3 g/dL (ref 31.5–35.7)
MCV: 92 fL (ref 79–97)
Monocytes Absolute: 0.6 10*3/uL (ref 0.1–0.9)
Monocytes: 10 %
Neutrophils Absolute: 2.6 10*3/uL (ref 1.4–7.0)
Neutrophils: 46 %
Platelets: 369 10*3/uL (ref 150–450)
RBC: 4.78 x10E6/uL (ref 3.77–5.28)
RDW: 12.8 % (ref 11.7–15.4)
WBC: 5.7 10*3/uL (ref 3.4–10.8)

## 2020-06-25 NOTE — Progress Notes (Signed)
Hello Chelsea Finley,  Your lab result is normal and/or stable.Some minor variations that are not significant are commonly marked abnormal, but do not represent any medical problem for you.  Best regards, Chase Knebel, M.D.

## 2020-07-14 ENCOUNTER — Other Ambulatory Visit: Payer: Self-pay

## 2020-07-14 ENCOUNTER — Encounter (INDEPENDENT_AMBULATORY_CARE_PROVIDER_SITE_OTHER): Payer: Medicare PPO | Admitting: Ophthalmology

## 2020-07-14 DIAGNOSIS — H35033 Hypertensive retinopathy, bilateral: Secondary | ICD-10-CM

## 2020-07-14 DIAGNOSIS — H43813 Vitreous degeneration, bilateral: Secondary | ICD-10-CM

## 2020-07-14 DIAGNOSIS — I10 Essential (primary) hypertension: Secondary | ICD-10-CM | POA: Diagnosis not present

## 2020-07-14 DIAGNOSIS — H34831 Tributary (branch) retinal vein occlusion, right eye, with macular edema: Secondary | ICD-10-CM | POA: Diagnosis not present

## 2020-07-21 ENCOUNTER — Ambulatory Visit: Payer: Medicare PPO

## 2020-07-21 DIAGNOSIS — Z0000 Encounter for general adult medical examination without abnormal findings: Secondary | ICD-10-CM

## 2020-07-21 NOTE — Progress Notes (Signed)
Unable to complete telephone visit with patient. I was unable to reach her on her home number and it said no voicemail was set up the 2nd and 3rd time I called.  I did speak with the patient on her cell number and started to begin the visit and her phone lost service. Tried to call back x2 and phone states that no voicemail had been set up.

## 2020-08-01 ENCOUNTER — Encounter (INDEPENDENT_AMBULATORY_CARE_PROVIDER_SITE_OTHER): Payer: Medicare PPO | Admitting: Ophthalmology

## 2020-09-08 ENCOUNTER — Other Ambulatory Visit: Payer: Self-pay

## 2020-09-08 ENCOUNTER — Encounter (INDEPENDENT_AMBULATORY_CARE_PROVIDER_SITE_OTHER): Payer: Medicare PPO | Admitting: Ophthalmology

## 2020-09-08 DIAGNOSIS — H353121 Nonexudative age-related macular degeneration, left eye, early dry stage: Secondary | ICD-10-CM | POA: Diagnosis not present

## 2020-09-08 DIAGNOSIS — H35033 Hypertensive retinopathy, bilateral: Secondary | ICD-10-CM | POA: Diagnosis not present

## 2020-09-08 DIAGNOSIS — I10 Essential (primary) hypertension: Secondary | ICD-10-CM

## 2020-09-08 DIAGNOSIS — H353112 Nonexudative age-related macular degeneration, right eye, intermediate dry stage: Secondary | ICD-10-CM

## 2020-09-08 DIAGNOSIS — H34831 Tributary (branch) retinal vein occlusion, right eye, with macular edema: Secondary | ICD-10-CM | POA: Diagnosis not present

## 2020-09-08 DIAGNOSIS — H43813 Vitreous degeneration, bilateral: Secondary | ICD-10-CM

## 2020-09-24 ENCOUNTER — Ambulatory Visit (INDEPENDENT_AMBULATORY_CARE_PROVIDER_SITE_OTHER): Payer: Medicare PPO

## 2020-09-24 ENCOUNTER — Other Ambulatory Visit: Payer: Self-pay

## 2020-09-24 DIAGNOSIS — Z23 Encounter for immunization: Secondary | ICD-10-CM | POA: Diagnosis not present

## 2020-11-10 ENCOUNTER — Other Ambulatory Visit: Payer: Self-pay

## 2020-11-10 ENCOUNTER — Encounter (INDEPENDENT_AMBULATORY_CARE_PROVIDER_SITE_OTHER): Payer: Medicare PPO | Admitting: Ophthalmology

## 2020-11-10 DIAGNOSIS — H348312 Tributary (branch) retinal vein occlusion, right eye, stable: Secondary | ICD-10-CM | POA: Diagnosis not present

## 2020-11-10 DIAGNOSIS — H353132 Nonexudative age-related macular degeneration, bilateral, intermediate dry stage: Secondary | ICD-10-CM | POA: Diagnosis not present

## 2020-11-10 DIAGNOSIS — I10 Essential (primary) hypertension: Secondary | ICD-10-CM | POA: Diagnosis not present

## 2020-11-10 DIAGNOSIS — H43813 Vitreous degeneration, bilateral: Secondary | ICD-10-CM

## 2020-11-10 DIAGNOSIS — H35033 Hypertensive retinopathy, bilateral: Secondary | ICD-10-CM | POA: Diagnosis not present

## 2020-12-25 ENCOUNTER — Ambulatory Visit: Payer: Medicare PPO | Admitting: Family Medicine

## 2020-12-25 ENCOUNTER — Other Ambulatory Visit: Payer: Self-pay

## 2020-12-25 ENCOUNTER — Encounter: Payer: Self-pay | Admitting: Family Medicine

## 2020-12-25 DIAGNOSIS — I10 Essential (primary) hypertension: Secondary | ICD-10-CM

## 2020-12-25 DIAGNOSIS — E7841 Elevated Lipoprotein(a): Secondary | ICD-10-CM | POA: Diagnosis not present

## 2020-12-25 DIAGNOSIS — E039 Hypothyroidism, unspecified: Secondary | ICD-10-CM | POA: Diagnosis not present

## 2020-12-25 MED ORDER — LISINOPRIL 20 MG PO TABS: 20.0000 mg | ORAL_TABLET | Freq: Every day | ORAL | 1 refills | Status: DC

## 2020-12-25 MED ORDER — AMLODIPINE BESYLATE 5 MG PO TABS: 5.0000 mg | ORAL_TABLET | Freq: Every day | ORAL | 1 refills | Status: DC

## 2020-12-25 MED ORDER — PRAVASTATIN SODIUM 80 MG PO TABS: 80.0000 mg | ORAL_TABLET | Freq: Every day | ORAL | 1 refills | Status: DC

## 2020-12-25 MED ORDER — SYNTHROID 125 MCG PO TABS: 125.0000 ug | ORAL_TABLET | Freq: Every day | ORAL | 1 refills | Status: DC

## 2020-12-25 MED ORDER — LISINOPRIL 10 MG PO TABS: 10.0000 mg | ORAL_TABLET | Freq: Every day | ORAL | 1 refills | Status: DC

## 2020-12-25 NOTE — Progress Notes (Signed)
Subjective:  Patient ID: Chelsea Finley, female    DOB: 09-14-1941  Age: 80 y.o. MRN: [id number]  CC: Medical Management of Chronic Issues   HPI Chelsea Finley presents for  follow-up of hypertension. Patient has no history of headache chest pain or shortness of breath or recent cough. Patient also denies symptoms of TIA such as focal numbness or weakness. Patient denies side effects from medication. States taking it regularly.  Patient presents for follow-up on  thyroid. The patient has a history of hypothyroidism for many years. It has been stable recently. Pt. denies any change in  voice, loss of hair, heat or cold intolerance. Energy level has been adequate to good. Patient denies constipation and diarrhea. No myxedema. Medication is as noted below. Verified that pt is taking it daily on an empty stomach. Well tolerated. History Chelsea Finley has a past medical history of Hyperlipidemia and Hypertension.   She has a past surgical history that includes Rotator cuff repair (05/2010); Knee Arthroplasty (11/16/2011); Appendectomy; Tubal ligation; and Knee Arthroplasty (02/01/2012).   Her family history includes Cancer in her father; Depression in her mother.She reports that she has never smoked. She has never used smokeless tobacco. She reports that she does not drink alcohol and does not use drugs.  Current Outpatient Medications on File Prior to Visit  Medication Sig Dispense Refill  . dorzolamide (TRUSOPT) 2 % ophthalmic solution  (Patient not taking: Reported on 12/25/2020)    . mupirocin cream (BACTROBAN) 2 % Apply 1 application topically 2 (two) times daily. (Patient not taking: Reported on 12/25/2020) 30 g 0   No current facility-administered medications on file prior to visit.    ROS Review of Systems  Constitutional: Negative.   HENT: Negative.   Eyes: Negative for visual disturbance.  Respiratory: Negative for shortness of breath.   Cardiovascular: Negative for chest pain.   Gastrointestinal: Negative for abdominal pain.  Musculoskeletal: Negative for arthralgias.    Objective:  BP (!) 147/83   Pulse 77   Temp 98.2 F (36.8 C) (Temporal)   Resp 20   Ht _0  (1.575 m)   Wt 208 lb 2 oz (94.4 kg)   SpO2 95%   BMI 38.07 kg/m   BP Readings from Last 3 Encounters:  12/25/20 (!) 147/83  06/24/20 136/80  04/07/20 137/82    Wt Readings from Last 3 Encounters:  12/25/20 208 lb 2 oz (94.4 kg)  06/24/20 206 lb 4 oz (93.6 kg)  04/07/20 208 lb (94.3 kg)     Physical Exam Constitutional:      General: She is not in acute distress.    Appearance: She is well-developed and well-nourished.  Cardiovascular:     Rate and Rhythm: Normal rate and regular rhythm.  Pulmonary:     Breath sounds: Normal breath sounds.  Skin:    General: Skin is warm and dry.  Neurological:     Mental Status: She is alert and oriented to person, place, and time.  Psychiatric:        Mood and Affect: Mood and affect normal.       Assessment & Plan:   Chelsea Finley was seen today for medical management of chronic issues.  Diagnoses and all orders for this visit:  Hypothyroidism, unspecified type -     SYNTHROID 125 MCG tablet; Take 1 tablet (125 mcg total) by mouth daily before breakfast. (will need labs in July) -     CBC with Differential/Platelet -     CMP14+EGFR -  TSH + free T4  Elevated lipoprotein(a) -     pravastatin (PRAVACHOL) 80 MG tablet; Take 1 tablet (80 mg total) by mouth daily. -     CBC with Differential/Platelet -     CMP14+EGFR -     Lipid panel  Essential hypertension -     Discontinue: lisinopril (ZESTRIL) 10 MG tablet; Take 1 tablet (10 mg total) by mouth daily. -     amLODipine (NORVASC) 5 MG tablet; Take 1 tablet (5 mg total) by mouth daily. -     CBC with Differential/Platelet -     CMP14+EGFR -     lisinopril (ZESTRIL) 20 MG tablet; Take 1 tablet (20 mg total) by mouth daily.   Allergies as of 12/25/2020      Reactions   Morphine And  Related Nausea And Vomiting      Medication List       Accurate as of December 25, 2020  8:57 AM. If you have any questions, ask your nurse or doctor.        amLODipine 5 MG tablet Commonly known as: NORVASC Take 1 tablet (5 mg total) by mouth daily.   dorzolamide 2 % ophthalmic solution Commonly known as: TRUSOPT   lisinopril 20 MG tablet Commonly known as: ZESTRIL Take 1 tablet (20 mg total) by mouth daily. What changed:   medication strength  how much to take Changed by: Claretta Fraise, MD   mupirocin cream 2 % Commonly known as: BACTROBAN Apply 1 application topically 2 (two) times daily.   pravastatin 80 MG tablet Commonly known as: PRAVACHOL Take 1 tablet (80 mg total) by mouth daily.   Synthroid 125 MCG tablet Generic drug: levothyroxine Take 1 tablet (125 mcg total) by mouth daily before breakfast. (will need labs in July)       Meds ordered this encounter  Medications  . SYNTHROID 125 MCG tablet    Sig: Take 1 tablet (125 mcg total) by mouth daily before breakfast. (will need labs in July)    Dispense:  90 tablet    Refill:  1  . pravastatin (PRAVACHOL) 80 MG tablet    Sig: Take 1 tablet (80 mg total) by mouth daily.    Dispense:  90 tablet    Refill:  1  . DISCONTD: lisinopril (ZESTRIL) 10 MG tablet    Sig: Take 1 tablet (10 mg total) by mouth daily.    Dispense:  90 tablet    Refill:  1  . amLODipine (NORVASC) 5 MG tablet    Sig: Take 1 tablet (5 mg total) by mouth daily.    Dispense:  90 tablet    Refill:  1  . lisinopril (ZESTRIL) 20 MG tablet    Sig: Take 1 tablet (20 mg total) by mouth daily.    Dispense:  90 tablet    Refill:  1    Continue current treatment regimen  Follow-up: Return in about 6 months (around 06/24/2021).  Claretta Fraise, M.D.

## 2020-12-26 LAB — CBC WITH DIFFERENTIAL/PLATELET
Basophils Absolute: 0 10*3/uL (ref 0.0–0.2)
Basos: 0 %
EOS (ABSOLUTE): 0.1 10*3/uL (ref 0.0–0.4)
Eos: 2 %
Hematocrit: 44.5 % (ref 34.0–46.6)
Hemoglobin: 14.4 g/dL (ref 11.1–15.9)
Immature Grans (Abs): 0 10*3/uL (ref 0.0–0.1)
Immature Granulocytes: 1 %
Lymphocytes Absolute: 3 10*3/uL (ref 0.7–3.1)
Lymphs: 44 %
MCH: 30.1 pg (ref 26.6–33.0)
MCHC: 32.4 g/dL (ref 31.5–35.7)
MCV: 93 fL (ref 79–97)
Monocytes Absolute: 0.7 10*3/uL (ref 0.1–0.9)
Monocytes: 11 %
Neutrophils Absolute: 2.7 10*3/uL (ref 1.4–7.0)
Neutrophils: 42 %
Platelets: 364 10*3/uL (ref 150–450)
RBC: 4.79 x10E6/uL (ref 3.77–5.28)
RDW: 12.3 % (ref 11.7–15.4)
WBC: 6.5 10*3/uL (ref 3.4–10.8)

## 2020-12-26 LAB — CMP14+EGFR
ALT: 15 IU/L (ref 0–32)
AST: 23 IU/L (ref 0–40)
Albumin/Globulin Ratio: 1.7 (ref 1.2–2.2)
Albumin: 4.7 g/dL — ABNORMAL HIGH (ref 3.6–4.6)
Alkaline Phosphatase: 80 IU/L (ref 44–121)
BUN/Creatinine Ratio: 21 (ref 12–28)
BUN: 19 mg/dL (ref 8–27)
Bilirubin Total: 0.3 mg/dL (ref 0.0–1.2)
CO2: 22 mmol/L (ref 20–29)
Calcium: 9.4 mg/dL (ref 8.7–10.3)
Chloride: 103 mmol/L (ref 96–106)
Creatinine, Ser: 0.89 mg/dL (ref 0.57–1.00)
GFR calc Af Amer: 70 mL/min/{1.73_m2} (ref >59)
GFR calc non Af Amer: 61 mL/min/{1.73_m2} (ref >59)
Globulin, Total: 2.7 g/dL (ref 1.5–4.5)
Glucose: 102 mg/dL — ABNORMAL HIGH (ref 65–99)
Potassium: 4.3 mmol/L (ref 3.5–5.2)
Sodium: 140 mmol/L (ref 134–144)
Total Protein: 7.4 g/dL (ref 6.0–8.5)

## 2020-12-26 LAB — TSH+FREE T4
Free T4: 1.88 ng/dL — ABNORMAL HIGH (ref 0.82–1.77)
TSH: 0.723 u[IU]/mL (ref 0.450–4.500)

## 2020-12-26 LAB — LIPID PANEL
Chol/HDL Ratio: 2.8 ratio (ref 0.0–4.4)
Cholesterol, Total: 164 mg/dL (ref 100–199)
HDL: 59 mg/dL (ref >39)
LDL Chol Calc (NIH): 92 mg/dL (ref 0–99)
Triglycerides: 69 mg/dL (ref 0–149)
VLDL Cholesterol Cal: 13 mg/dL (ref 5–40)

## 2020-12-28 NOTE — Progress Notes (Signed)
Hello Mirabelle,  Your lab result is normal and/or stable.Some minor variations that are not significant are commonly marked abnormal, but do not represent any medical problem for you.  Best regards, Josiah Nieto, M.D.

## 2021-01-12 ENCOUNTER — Other Ambulatory Visit: Payer: Self-pay

## 2021-01-12 ENCOUNTER — Encounter (INDEPENDENT_AMBULATORY_CARE_PROVIDER_SITE_OTHER): Payer: Medicare PPO | Admitting: Ophthalmology

## 2021-01-12 DIAGNOSIS — I10 Essential (primary) hypertension: Secondary | ICD-10-CM

## 2021-01-12 DIAGNOSIS — H35033 Hypertensive retinopathy, bilateral: Secondary | ICD-10-CM

## 2021-01-12 DIAGNOSIS — H34831 Tributary (branch) retinal vein occlusion, right eye, with macular edema: Secondary | ICD-10-CM

## 2021-01-12 DIAGNOSIS — H43813 Vitreous degeneration, bilateral: Secondary | ICD-10-CM | POA: Diagnosis not present

## 2021-01-15 DIAGNOSIS — H40051 Ocular hypertension, right eye: Secondary | ICD-10-CM | POA: Diagnosis not present

## 2021-01-15 DIAGNOSIS — H34831 Tributary (branch) retinal vein occlusion, right eye, with macular edema: Secondary | ICD-10-CM | POA: Diagnosis not present

## 2021-01-15 DIAGNOSIS — H401111 Primary open-angle glaucoma, right eye, mild stage: Secondary | ICD-10-CM | POA: Diagnosis not present

## 2021-02-09 ENCOUNTER — Other Ambulatory Visit: Payer: Self-pay

## 2021-02-09 ENCOUNTER — Encounter (INDEPENDENT_AMBULATORY_CARE_PROVIDER_SITE_OTHER): Payer: Medicare PPO | Admitting: Ophthalmology

## 2021-02-09 DIAGNOSIS — H35033 Hypertensive retinopathy, bilateral: Secondary | ICD-10-CM

## 2021-02-09 DIAGNOSIS — H34831 Tributary (branch) retinal vein occlusion, right eye, with macular edema: Secondary | ICD-10-CM

## 2021-02-09 DIAGNOSIS — I10 Essential (primary) hypertension: Secondary | ICD-10-CM

## 2021-02-09 DIAGNOSIS — H43813 Vitreous degeneration, bilateral: Secondary | ICD-10-CM | POA: Diagnosis not present

## 2021-02-26 DIAGNOSIS — H401111 Primary open-angle glaucoma, right eye, mild stage: Secondary | ICD-10-CM | POA: Diagnosis not present

## 2021-02-26 DIAGNOSIS — H40051 Ocular hypertension, right eye: Secondary | ICD-10-CM | POA: Diagnosis not present

## 2021-03-09 ENCOUNTER — Other Ambulatory Visit: Payer: Self-pay

## 2021-03-09 ENCOUNTER — Encounter (INDEPENDENT_AMBULATORY_CARE_PROVIDER_SITE_OTHER): Payer: Medicare PPO | Admitting: Ophthalmology

## 2021-03-09 DIAGNOSIS — H43813 Vitreous degeneration, bilateral: Secondary | ICD-10-CM

## 2021-03-09 DIAGNOSIS — H34831 Tributary (branch) retinal vein occlusion, right eye, with macular edema: Secondary | ICD-10-CM

## 2021-03-09 DIAGNOSIS — I10 Essential (primary) hypertension: Secondary | ICD-10-CM

## 2021-03-09 DIAGNOSIS — H353132 Nonexudative age-related macular degeneration, bilateral, intermediate dry stage: Secondary | ICD-10-CM | POA: Diagnosis not present

## 2021-03-09 DIAGNOSIS — H35033 Hypertensive retinopathy, bilateral: Secondary | ICD-10-CM | POA: Diagnosis not present

## 2021-04-02 DIAGNOSIS — H40051 Ocular hypertension, right eye: Secondary | ICD-10-CM | POA: Diagnosis not present

## 2021-04-02 DIAGNOSIS — H34831 Tributary (branch) retinal vein occlusion, right eye, with macular edema: Secondary | ICD-10-CM | POA: Diagnosis not present

## 2021-04-02 DIAGNOSIS — H401111 Primary open-angle glaucoma, right eye, mild stage: Secondary | ICD-10-CM | POA: Diagnosis not present

## 2021-04-04 ENCOUNTER — Other Ambulatory Visit: Payer: Self-pay | Admitting: Family Medicine

## 2021-04-04 DIAGNOSIS — I10 Essential (primary) hypertension: Secondary | ICD-10-CM

## 2021-04-06 ENCOUNTER — Encounter (INDEPENDENT_AMBULATORY_CARE_PROVIDER_SITE_OTHER): Payer: Medicare PPO | Admitting: Ophthalmology

## 2021-04-06 ENCOUNTER — Other Ambulatory Visit: Payer: Self-pay

## 2021-04-06 DIAGNOSIS — H34831 Tributary (branch) retinal vein occlusion, right eye, with macular edema: Secondary | ICD-10-CM

## 2021-04-06 DIAGNOSIS — I10 Essential (primary) hypertension: Secondary | ICD-10-CM | POA: Diagnosis not present

## 2021-04-06 DIAGNOSIS — H43813 Vitreous degeneration, bilateral: Secondary | ICD-10-CM

## 2021-04-06 DIAGNOSIS — H35033 Hypertensive retinopathy, bilateral: Secondary | ICD-10-CM

## 2021-04-06 DIAGNOSIS — H353132 Nonexudative age-related macular degeneration, bilateral, intermediate dry stage: Secondary | ICD-10-CM

## 2021-05-08 ENCOUNTER — Other Ambulatory Visit: Payer: Self-pay

## 2021-05-08 ENCOUNTER — Encounter (INDEPENDENT_AMBULATORY_CARE_PROVIDER_SITE_OTHER): Payer: Medicare PPO | Admitting: Ophthalmology

## 2021-05-08 DIAGNOSIS — H353132 Nonexudative age-related macular degeneration, bilateral, intermediate dry stage: Secondary | ICD-10-CM | POA: Diagnosis not present

## 2021-05-08 DIAGNOSIS — I10 Essential (primary) hypertension: Secondary | ICD-10-CM | POA: Diagnosis not present

## 2021-05-08 DIAGNOSIS — H34831 Tributary (branch) retinal vein occlusion, right eye, with macular edema: Secondary | ICD-10-CM | POA: Diagnosis not present

## 2021-05-08 DIAGNOSIS — H43813 Vitreous degeneration, bilateral: Secondary | ICD-10-CM | POA: Diagnosis not present

## 2021-05-08 DIAGNOSIS — H35033 Hypertensive retinopathy, bilateral: Secondary | ICD-10-CM

## 2021-06-05 ENCOUNTER — Encounter (INDEPENDENT_AMBULATORY_CARE_PROVIDER_SITE_OTHER): Payer: Medicare PPO | Admitting: Ophthalmology

## 2021-06-05 ENCOUNTER — Other Ambulatory Visit: Payer: Self-pay

## 2021-06-05 DIAGNOSIS — I10 Essential (primary) hypertension: Secondary | ICD-10-CM | POA: Diagnosis not present

## 2021-06-05 DIAGNOSIS — H353132 Nonexudative age-related macular degeneration, bilateral, intermediate dry stage: Secondary | ICD-10-CM

## 2021-06-05 DIAGNOSIS — H34831 Tributary (branch) retinal vein occlusion, right eye, with macular edema: Secondary | ICD-10-CM

## 2021-06-05 DIAGNOSIS — H35033 Hypertensive retinopathy, bilateral: Secondary | ICD-10-CM

## 2021-06-05 DIAGNOSIS — H43813 Vitreous degeneration, bilateral: Secondary | ICD-10-CM

## 2021-06-22 DIAGNOSIS — H40033 Anatomical narrow angle, bilateral: Secondary | ICD-10-CM | POA: Diagnosis not present

## 2021-06-22 DIAGNOSIS — H04123 Dry eye syndrome of bilateral lacrimal glands: Secondary | ICD-10-CM | POA: Diagnosis not present

## 2021-06-25 ENCOUNTER — Encounter: Payer: Self-pay | Admitting: Family Medicine

## 2021-06-25 ENCOUNTER — Other Ambulatory Visit: Payer: Self-pay

## 2021-06-25 ENCOUNTER — Ambulatory Visit: Payer: Medicare PPO | Admitting: Family Medicine

## 2021-06-25 VITALS — BP 152/94 | HR 67 | Temp 97.9°F | Ht 62.0 in | Wt 208.6 lb

## 2021-06-25 DIAGNOSIS — E7841 Elevated Lipoprotein(a): Secondary | ICD-10-CM

## 2021-06-25 DIAGNOSIS — E039 Hypothyroidism, unspecified: Secondary | ICD-10-CM

## 2021-06-25 DIAGNOSIS — I10 Essential (primary) hypertension: Secondary | ICD-10-CM | POA: Diagnosis not present

## 2021-06-25 MED ORDER — LISINOPRIL 20 MG PO TABS: ORAL_TABLET | ORAL | 3 refills | Status: DC

## 2021-06-25 MED ORDER — PRAVASTATIN SODIUM 80 MG PO TABS: 80.0000 mg | ORAL_TABLET | Freq: Every day | ORAL | 3 refills | Status: DC

## 2021-06-25 MED ORDER — AMLODIPINE BESYLATE 5 MG PO TABS: 5.0000 mg | ORAL_TABLET | Freq: Every day | ORAL | 3 refills | Status: DC

## 2021-06-25 NOTE — Progress Notes (Signed)
Subjective:  Patient ID: Chelsea Finley, female    DOB: 26-Dec-1940  Age: 80 y.o. MRN: [id number]  CC: Medical Management of Chronic Issues   HPI GAVYN ZOSS presents for  follow-up on  thyroid. The patient has a history of hypothyroidism for many years. It has been stable recently. Pt. denies any change in  voice, loss of hair, heat intolerance. Some cold intolerance. Energy level has been adequate to good. Patient denies constipation and diarrhea. No myxedema. Medication is as noted below. Verified that pt is taking it daily on an empty stomach. Well tolerated.   Depression screen [facility] 2/9 06/25/2021 12/25/2020 06/24/2020  Decreased Interest 0 0 0  Down, Depressed, Hopeless 0 0 0  PHQ - 2 Score 0 0 0    History Aloni has a past medical history of Arthritis of knee, left (02/01/2012), Elevated lipoprotein(a) (12/16/2019), Hyperlipidemia, and Hypertension.   She has a past surgical history that includes Rotator cuff repair (05/2010); Knee Arthroplasty (11/16/2011); Appendectomy; Tubal ligation; and Knee Arthroplasty (02/01/2012).   Her family history includes Cancer in her father; Depression in her mother.She reports that she has never smoked. She has never used smokeless tobacco. She reports that she does not drink alcohol and does not use drugs.    ROS Review of Systems  Constitutional: Negative.   HENT: Negative.    Eyes:  Negative for visual disturbance.  Respiratory:  Negative for shortness of breath.   Cardiovascular:  Negative for chest pain.  Gastrointestinal:  Negative for abdominal pain.  Musculoskeletal:  Negative for arthralgias.   Objective:  BP (!) 152/94   Pulse 67   Temp 97.9 F (36.6 C)   Ht _0  (1.575 m)   Wt 208 lb 9.6 oz (94.6 kg)   SpO2 98%   BMI 38.15 kg/m   BP Readings from Last 3 Encounters:  06/25/21 (!) 152/94  12/25/20 (!) 147/83  06/24/20 136/80    Wt Readings from Last 3 Encounters:  06/25/21 208 lb 9.6 oz (94.6 kg)  12/25/20 208 lb 2  oz (94.4 kg)  06/24/20 206 lb 4 oz (93.6 kg)     Physical Exam Constitutional:      General: She is not in acute distress.    Appearance: She is well-developed.  HENT:     Head: Normocephalic and atraumatic.  Eyes:     Conjunctiva/sclera: Conjunctivae normal.     Pupils: Pupils are equal, round, and reactive to light.  Neck:     Thyroid: No thyromegaly.  Cardiovascular:     Rate and Rhythm: Normal rate and regular rhythm.     Heart sounds: Normal heart sounds. No murmur heard. Pulmonary:     Effort: Pulmonary effort is normal. No respiratory distress.     Breath sounds: Normal breath sounds. No wheezing or rales.  Abdominal:     General: Bowel sounds are normal. There is no distension.     Palpations: Abdomen is soft.     Tenderness: There is no abdominal tenderness.  Musculoskeletal:        General: Normal range of motion.     Cervical back: Normal range of motion and neck supple.  Lymphadenopathy:     Cervical: No cervical adenopathy.  Skin:    General: Skin is warm and dry.  Neurological:     Mental Status: She is alert and oriented to person, place, and time.  Psychiatric:        Behavior: Behavior normal.  Thought Content: Thought content normal.        Judgment: Judgment normal.      Assessment & Plan:   Aljean was seen today for medical management of chronic issues.  Diagnoses and all orders for this visit:  Hypothyroidism, unspecified type -     TSH + free T4  Essential hypertension -     CBC with Differential/Platelet -     CMP14+EGFR -     amLODipine (NORVASC) 5 MG tablet; Take 1 tablet (5 mg total) by mouth daily. -     lisinopril (ZESTRIL) 20 MG tablet; TAKE ONE (1) TABLET EACH DAY  Elevated lipoprotein(a) -     Lipid panel -     pravastatin (PRAVACHOL) 80 MG tablet; Take 1 tablet (80 mg total) by mouth daily.      I am having Artisha Capri. Jarnagin maintain her mupirocin cream, dorzolamide, Synthroid, amLODipine, lisinopril, and  pravastatin.  Allergies as of 06/25/2021       Reactions   Morphine And Related Nausea And Vomiting        Medication List        Accurate as of June 25, 2021  8:41 AM. If you have any questions, ask your nurse or doctor.          amLODipine 5 MG tablet Commonly known as: NORVASC Take 1 tablet (5 mg total) by mouth daily.   dorzolamide 2 % ophthalmic solution Commonly known as: TRUSOPT   lisinopril 20 MG tablet Commonly known as: ZESTRIL TAKE ONE (1) TABLET EACH DAY   mupirocin cream 2 % Commonly known as: BACTROBAN Apply 1 application topically 2 (two) times daily.   pravastatin 80 MG tablet Commonly known as: PRAVACHOL Take 1 tablet (80 mg total) by mouth daily.   Synthroid 125 MCG tablet Generic drug: levothyroxine Take 1 tablet (125 mcg total) by mouth daily before breakfast. (will need labs in July)         Follow-up: No follow-ups on file.  Claretta Fraise, M.D.

## 2021-06-25 NOTE — Patient Instructions (Signed)
For skin take Biotin Apply eucerin or Cetaphil

## 2021-06-26 LAB — CBC WITH DIFFERENTIAL/PLATELET
Basophils Absolute: 0 10*3/uL (ref 0.0–0.2)
Basos: 0 %
EOS (ABSOLUTE): 0.1 10*3/uL (ref 0.0–0.4)
Eos: 2 %
Hematocrit: 40.5 % (ref 34.0–46.6)
Hemoglobin: 13.5 g/dL (ref 11.1–15.9)
Immature Grans (Abs): 0 10*3/uL (ref 0.0–0.1)
Immature Granulocytes: 0 %
Lymphocytes Absolute: 2.6 10*3/uL (ref 0.7–3.1)
Lymphs: 40 %
MCH: 30.8 pg (ref 26.6–33.0)
MCHC: 33.3 g/dL (ref 31.5–35.7)
MCV: 93 fL (ref 79–97)
Monocytes Absolute: 0.8 10*3/uL (ref 0.1–0.9)
Monocytes: 12 %
Neutrophils Absolute: 3 10*3/uL (ref 1.4–7.0)
Neutrophils: 46 %
Platelets: 350 10*3/uL (ref 150–450)
RBC: 4.38 x10E6/uL (ref 3.77–5.28)
RDW: 13.4 % (ref 11.7–15.4)
WBC: 6.6 10*3/uL (ref 3.4–10.8)

## 2021-06-26 LAB — CMP14+EGFR
ALT: 19 IU/L (ref 0–32)
AST: 25 IU/L (ref 0–40)
Albumin/Globulin Ratio: 1.7 (ref 1.2–2.2)
Albumin: 4.3 g/dL (ref 3.6–4.6)
Alkaline Phosphatase: 60 IU/L (ref 44–121)
BUN/Creatinine Ratio: 18 (ref 12–28)
BUN: 16 mg/dL (ref 8–27)
Bilirubin Total: 0.4 mg/dL (ref 0.0–1.2)
CO2: 20 mmol/L (ref 20–29)
Calcium: 9.3 mg/dL (ref 8.7–10.3)
Chloride: 107 mmol/L — ABNORMAL HIGH (ref 96–106)
Creatinine, Ser: 0.9 mg/dL (ref 0.57–1.00)
Globulin, Total: 2.5 g/dL (ref 1.5–4.5)
Glucose: 97 mg/dL (ref 65–99)
Potassium: 3.8 mmol/L (ref 3.5–5.2)
Sodium: 146 mmol/L — ABNORMAL HIGH (ref 134–144)
Total Protein: 6.8 g/dL (ref 6.0–8.5)
eGFR: 64 mL/min/{1.73_m2} (ref >59)

## 2021-06-26 LAB — LIPID PANEL
Chol/HDL Ratio: 2.4 ratio (ref 0.0–4.4)
Cholesterol, Total: 155 mg/dL (ref 100–199)
HDL: 64 mg/dL (ref >39)
LDL Chol Calc (NIH): 76 mg/dL (ref 0–99)
Triglycerides: 77 mg/dL (ref 0–149)
VLDL Cholesterol Cal: 15 mg/dL (ref 5–40)

## 2021-06-26 LAB — TSH+FREE T4
Free T4: 1.56 ng/dL (ref 0.82–1.77)
TSH: 1.31 u[IU]/mL (ref 0.450–4.500)

## 2021-06-26 NOTE — Progress Notes (Signed)
Hello Nasirah,  Your lab result is normal and/or stable.Some minor variations that are not significant are commonly marked abnormal, but do not represent any medical problem for you.  Best regards, Omare Bilotta, M.D.

## 2021-07-03 ENCOUNTER — Other Ambulatory Visit: Payer: Self-pay

## 2021-07-03 ENCOUNTER — Encounter (INDEPENDENT_AMBULATORY_CARE_PROVIDER_SITE_OTHER): Payer: Medicare PPO | Admitting: Ophthalmology

## 2021-07-03 DIAGNOSIS — H35033 Hypertensive retinopathy, bilateral: Secondary | ICD-10-CM

## 2021-07-03 DIAGNOSIS — H43813 Vitreous degeneration, bilateral: Secondary | ICD-10-CM

## 2021-07-03 DIAGNOSIS — I10 Essential (primary) hypertension: Secondary | ICD-10-CM

## 2021-07-03 DIAGNOSIS — H34831 Tributary (branch) retinal vein occlusion, right eye, with macular edema: Secondary | ICD-10-CM

## 2021-07-03 DIAGNOSIS — H353132 Nonexudative age-related macular degeneration, bilateral, intermediate dry stage: Secondary | ICD-10-CM

## 2021-07-23 ENCOUNTER — Encounter: Payer: Self-pay | Admitting: *Deleted

## 2021-08-11 ENCOUNTER — Other Ambulatory Visit: Payer: Self-pay | Admitting: Family Medicine

## 2021-08-11 DIAGNOSIS — E039 Hypothyroidism, unspecified: Secondary | ICD-10-CM

## 2021-08-14 ENCOUNTER — Other Ambulatory Visit: Payer: Self-pay

## 2021-08-14 ENCOUNTER — Encounter (INDEPENDENT_AMBULATORY_CARE_PROVIDER_SITE_OTHER): Payer: Medicare PPO | Admitting: Ophthalmology

## 2021-08-14 DIAGNOSIS — H353132 Nonexudative age-related macular degeneration, bilateral, intermediate dry stage: Secondary | ICD-10-CM | POA: Diagnosis not present

## 2021-08-14 DIAGNOSIS — H34831 Tributary (branch) retinal vein occlusion, right eye, with macular edema: Secondary | ICD-10-CM | POA: Diagnosis not present

## 2021-08-14 DIAGNOSIS — H43813 Vitreous degeneration, bilateral: Secondary | ICD-10-CM

## 2021-08-14 DIAGNOSIS — H35033 Hypertensive retinopathy, bilateral: Secondary | ICD-10-CM

## 2021-08-14 DIAGNOSIS — I10 Essential (primary) hypertension: Secondary | ICD-10-CM

## 2021-08-20 DIAGNOSIS — H34831 Tributary (branch) retinal vein occlusion, right eye, with macular edema: Secondary | ICD-10-CM | POA: Diagnosis not present

## 2021-08-20 DIAGNOSIS — H401111 Primary open-angle glaucoma, right eye, mild stage: Secondary | ICD-10-CM | POA: Diagnosis not present

## 2021-08-20 DIAGNOSIS — H40051 Ocular hypertension, right eye: Secondary | ICD-10-CM | POA: Diagnosis not present

## 2021-09-11 ENCOUNTER — Encounter (INDEPENDENT_AMBULATORY_CARE_PROVIDER_SITE_OTHER): Payer: Medicare PPO | Admitting: Ophthalmology

## 2021-09-16 ENCOUNTER — Other Ambulatory Visit: Payer: Self-pay

## 2021-09-16 ENCOUNTER — Ambulatory Visit (INDEPENDENT_AMBULATORY_CARE_PROVIDER_SITE_OTHER): Payer: Medicare PPO

## 2021-09-16 DIAGNOSIS — Z23 Encounter for immunization: Secondary | ICD-10-CM

## 2021-09-18 ENCOUNTER — Encounter (INDEPENDENT_AMBULATORY_CARE_PROVIDER_SITE_OTHER): Payer: Medicare PPO | Admitting: Ophthalmology

## 2021-09-25 ENCOUNTER — Other Ambulatory Visit: Payer: Self-pay

## 2021-09-25 ENCOUNTER — Encounter (INDEPENDENT_AMBULATORY_CARE_PROVIDER_SITE_OTHER): Payer: Medicare PPO | Admitting: Ophthalmology

## 2021-09-25 DIAGNOSIS — H43813 Vitreous degeneration, bilateral: Secondary | ICD-10-CM | POA: Diagnosis not present

## 2021-09-25 DIAGNOSIS — H34831 Tributary (branch) retinal vein occlusion, right eye, with macular edema: Secondary | ICD-10-CM | POA: Diagnosis not present

## 2021-09-25 DIAGNOSIS — H35033 Hypertensive retinopathy, bilateral: Secondary | ICD-10-CM

## 2021-09-25 DIAGNOSIS — H353132 Nonexudative age-related macular degeneration, bilateral, intermediate dry stage: Secondary | ICD-10-CM

## 2021-09-25 DIAGNOSIS — I10 Essential (primary) hypertension: Secondary | ICD-10-CM

## 2021-10-02 DIAGNOSIS — H401111 Primary open-angle glaucoma, right eye, mild stage: Secondary | ICD-10-CM | POA: Diagnosis not present

## 2021-10-02 DIAGNOSIS — H40051 Ocular hypertension, right eye: Secondary | ICD-10-CM | POA: Diagnosis not present

## 2021-10-07 DIAGNOSIS — H40051 Ocular hypertension, right eye: Secondary | ICD-10-CM | POA: Diagnosis not present

## 2021-10-07 DIAGNOSIS — H401111 Primary open-angle glaucoma, right eye, mild stage: Secondary | ICD-10-CM | POA: Diagnosis not present

## 2021-10-14 DIAGNOSIS — H40051 Ocular hypertension, right eye: Secondary | ICD-10-CM | POA: Diagnosis not present

## 2021-10-14 DIAGNOSIS — H401111 Primary open-angle glaucoma, right eye, mild stage: Secondary | ICD-10-CM | POA: Diagnosis not present

## 2021-11-06 ENCOUNTER — Other Ambulatory Visit: Payer: Self-pay

## 2021-11-06 ENCOUNTER — Encounter (INDEPENDENT_AMBULATORY_CARE_PROVIDER_SITE_OTHER): Payer: Medicare PPO | Admitting: Ophthalmology

## 2021-11-06 DIAGNOSIS — I10 Essential (primary) hypertension: Secondary | ICD-10-CM

## 2021-11-06 DIAGNOSIS — H353132 Nonexudative age-related macular degeneration, bilateral, intermediate dry stage: Secondary | ICD-10-CM | POA: Diagnosis not present

## 2021-11-06 DIAGNOSIS — H35033 Hypertensive retinopathy, bilateral: Secondary | ICD-10-CM | POA: Diagnosis not present

## 2021-11-06 DIAGNOSIS — H34831 Tributary (branch) retinal vein occlusion, right eye, with macular edema: Secondary | ICD-10-CM

## 2021-11-06 DIAGNOSIS — H43813 Vitreous degeneration, bilateral: Secondary | ICD-10-CM | POA: Diagnosis not present

## 2021-11-19 DIAGNOSIS — H40051 Ocular hypertension, right eye: Secondary | ICD-10-CM | POA: Diagnosis not present

## 2021-11-19 DIAGNOSIS — H401111 Primary open-angle glaucoma, right eye, mild stage: Secondary | ICD-10-CM | POA: Diagnosis not present

## 2021-12-11 ENCOUNTER — Telehealth: Payer: Self-pay | Admitting: Family Medicine

## 2021-12-11 NOTE — Telephone Encounter (Signed)
No answer unable to leave a message for patient to call back and schedule Medicare Annual Wellness Visit (AWV) to be completed by video or phone.   Last AWV: 07/12/2019  Please schedule at anytime with Parnell  45 minute appointment  Any questions, please contact me at [phone]

## 2021-12-18 ENCOUNTER — Encounter (INDEPENDENT_AMBULATORY_CARE_PROVIDER_SITE_OTHER): Payer: Medicare PPO | Admitting: Ophthalmology

## 2021-12-18 ENCOUNTER — Other Ambulatory Visit: Payer: Self-pay

## 2021-12-18 DIAGNOSIS — H353132 Nonexudative age-related macular degeneration, bilateral, intermediate dry stage: Secondary | ICD-10-CM

## 2021-12-18 DIAGNOSIS — I10 Essential (primary) hypertension: Secondary | ICD-10-CM

## 2021-12-18 DIAGNOSIS — H34831 Tributary (branch) retinal vein occlusion, right eye, with macular edema: Secondary | ICD-10-CM | POA: Diagnosis not present

## 2021-12-18 DIAGNOSIS — H35033 Hypertensive retinopathy, bilateral: Secondary | ICD-10-CM | POA: Diagnosis not present

## 2021-12-18 DIAGNOSIS — H43813 Vitreous degeneration, bilateral: Secondary | ICD-10-CM | POA: Diagnosis not present

## 2021-12-24 ENCOUNTER — Ambulatory Visit (INDEPENDENT_AMBULATORY_CARE_PROVIDER_SITE_OTHER): Payer: Medicare PPO | Admitting: Family Medicine

## 2021-12-24 ENCOUNTER — Encounter: Payer: Self-pay | Admitting: Family Medicine

## 2021-12-24 VITALS — BP 161/76 | HR 74 | Temp 98.0°F | Ht 62.0 in | Wt 206.4 lb

## 2021-12-24 DIAGNOSIS — I10 Essential (primary) hypertension: Secondary | ICD-10-CM | POA: Diagnosis not present

## 2021-12-24 DIAGNOSIS — E7841 Elevated Lipoprotein(a): Secondary | ICD-10-CM

## 2021-12-24 DIAGNOSIS — E039 Hypothyroidism, unspecified: Secondary | ICD-10-CM

## 2021-12-24 NOTE — Progress Notes (Signed)
Subjective:  Patient ID: Chelsea Finley, female    DOB: 12-Oct-1941  Age: 81 y.o. MRN: [id number]  CC: Medical Management of Chronic Issues   HPI [name] presents for  follow-up on  thyroid. The patient has a history of hypothyroidism for many years. It has been stable recently. Pt. denies any change in  voice, loss of hair, heat or cold intolerance. Energy level has been adequate to good. Patient denies constipation and diarrhea. No myxedema. Medication is as noted below. Verified that pt is taking it daily on an empty stomach. Well tolerated.   presents for  follow-up of hypertension. Patient has no history of headache chest pain or shortness of breath or recent cough. Patient also denies symptoms of TIA such as focal numbness or weakness. Patient denies side effects from medication. States taking it regularly.  She forgot to take her BP med tis morning. That may be why pressure is up.   Depression screen [facility] 2/9 12/24/2021 06/25/2021 12/25/2020  Decreased Interest 0 0 0  Down, Depressed, Hopeless 0 0 0  PHQ - 2 Score 0 0 0    History [name] has a past medical history of Arthritis of knee, left (02/01/2012), Elevated lipoprotein(a) (12/16/2019), Hyperlipidemia, and Hypertension.   She has a past surgical history that includes Rotator cuff repair (05/2010); Knee Arthroplasty (11/16/2011); Appendectomy; Tubal ligation; and Knee Arthroplasty (02/01/2012).   Her family history includes Cancer in her father; Depression in her mother.She reports that she has never smoked. She has never used smokeless tobacco. She reports that she does not drink alcohol and does not use drugs.    ROS Review of Systems  Constitutional: Negative.   HENT: Negative.    Eyes:  Negative for visual disturbance.  Respiratory:  Negative for shortness of breath.   Cardiovascular:  Negative for chest pain.  Gastrointestinal:  Negative for abdominal pain.  Musculoskeletal:  Negative for arthralgias.    Objective:  BP (!) 161/76    Pulse 74    Temp 98 F (36.7 C)    Ht _0  (1.575 m)    Wt 206 lb 6.4 oz (93.6 kg)    SpO2 99%    BMI 37.75 kg/m   BP Readings from Last 3 Encounters:  12/24/21 (!) 161/76  06/25/21 (!) 152/94  12/25/20 (!) 147/83    Wt Readings from Last 3 Encounters:  12/24/21 206 lb 6.4 oz (93.6 kg)  06/25/21 208 lb 9.6 oz (94.6 kg)  12/25/20 208 lb 2 oz (94.4 kg)     Physical Exam Constitutional:      General: She is not in acute distress.    Appearance: She is well-developed.  HENT:     Head: Normocephalic and atraumatic.  Eyes:     Conjunctiva/sclera: Conjunctivae normal.     Pupils: Pupils are equal, round, and reactive to light.  Neck:     Thyroid: No thyromegaly.  Cardiovascular:     Rate and Rhythm: Normal rate and regular rhythm.     Heart sounds: Normal heart sounds. No murmur heard. Pulmonary:     Effort: Pulmonary effort is normal. No respiratory distress.     Breath sounds: Normal breath sounds. No wheezing or rales.  Abdominal:     General: Bowel sounds are normal. There is no distension.     Palpations: Abdomen is soft.     Tenderness: There is no abdominal tenderness.  Musculoskeletal:        General: Normal range of motion.  Cervical back: Normal range of motion and neck supple.  Lymphadenopathy:     Cervical: No cervical adenopathy.  Skin:    General: Skin is warm and dry.  Neurological:     Mental Status: She is alert and oriented to person, place, and time.  Psychiatric:        Behavior: Behavior normal.        Thought Content: Thought content normal.        Judgment: Judgment normal.      Assessment & Plan:   Vonetta was seen today for medical management of chronic issues.  Diagnoses and all orders for this visit:  Hypothyroidism, unspecified type -     TSH + free T4  Essential hypertension -     CBC with Differential/Platelet -     CMP14+EGFR  Elevated lipoprotein(a) -     Lipid panel    Home BP  readings recommended. Check daily at rest. If over 130/80 repeatedly, I will send in higher dose of amlodipine.    I am having Kynzli Rease. Byas maintain her mupirocin cream, dorzolamide, amLODipine, lisinopril, pravastatin, Synthroid, latanoprost, dorzolamide-timolol, and brimonidine.  Allergies as of 12/24/2021       Reactions   Morphine And Related Nausea And Vomiting        Medication List        Accurate as of December 24, 2021  8:29 AM. If you have any questions, ask your nurse or doctor.          amLODipine 5 MG tablet Commonly known as: NORVASC Take 1 tablet (5 mg total) by mouth daily.   brimonidine 0.2 % ophthalmic solution Commonly known as: ALPHAGAN Place into the right eye 3 (three) times daily.   dorzolamide 2 % ophthalmic solution Commonly known as: TRUSOPT   dorzolamide-timolol 22.3-6.8 MG/ML ophthalmic solution Commonly known as: COSOPT Place 1 drop into the right eye 2 (two) times daily.   latanoprost 0.005 % ophthalmic solution Commonly known as: XALATAN Place 1 drop into the right eye at bedtime.   lisinopril 20 MG tablet Commonly known as: ZESTRIL TAKE ONE (1) TABLET EACH DAY   mupirocin cream 2 % Commonly known as: BACTROBAN Apply 1 application topically 2 (two) times daily.   pravastatin 80 MG tablet Commonly known as: PRAVACHOL Take 1 tablet (80 mg total) by mouth daily.   Synthroid 125 MCG tablet Generic drug: levothyroxine TAKE ONE TABLET EACH MORNING BEFORE BREAKFAST         Follow-up: Return in about 6 months (around 06/23/2022).  Claretta Fraise, M.D.

## 2021-12-25 ENCOUNTER — Ambulatory Visit: Payer: Medicare PPO | Admitting: Family Medicine

## 2021-12-25 LAB — TSH+FREE T4
Free T4: 1.59 ng/dL (ref 0.82–1.77)
TSH: 2 u[IU]/mL (ref 0.450–4.500)

## 2021-12-25 LAB — LIPID PANEL
Chol/HDL Ratio: 2.4 ratio (ref 0.0–4.4)
Cholesterol, Total: 170 mg/dL (ref 100–199)
HDL: 70 mg/dL (ref >39)
LDL Chol Calc (NIH): 86 mg/dL (ref 0–99)
Triglycerides: 73 mg/dL (ref 0–149)
VLDL Cholesterol Cal: 14 mg/dL (ref 5–40)

## 2021-12-25 LAB — CMP14+EGFR
ALT: 20 IU/L (ref 0–32)
AST: 23 IU/L (ref 0–40)
Albumin/Globulin Ratio: 1.8 (ref 1.2–2.2)
Albumin: 4.6 g/dL (ref 3.6–4.6)
Alkaline Phosphatase: 68 IU/L (ref 44–121)
BUN/Creatinine Ratio: 18 (ref 12–28)
BUN: 16 mg/dL (ref 8–27)
Bilirubin Total: 0.3 mg/dL (ref 0.0–1.2)
CO2: 21 mmol/L (ref 20–29)
Calcium: 9.5 mg/dL (ref 8.7–10.3)
Chloride: 104 mmol/L (ref 96–106)
Creatinine, Ser: 0.91 mg/dL (ref 0.57–1.00)
Globulin, Total: 2.5 g/dL (ref 1.5–4.5)
Glucose: 97 mg/dL (ref 70–99)
Potassium: 4.1 mmol/L (ref 3.5–5.2)
Sodium: 140 mmol/L (ref 134–144)
Total Protein: 7.1 g/dL (ref 6.0–8.5)
eGFR: 63 mL/min/{1.73_m2} (ref >59)

## 2021-12-25 LAB — CBC WITH DIFFERENTIAL/PLATELET
Basophils Absolute: 0 10*3/uL (ref 0.0–0.2)
Basos: 0 %
EOS (ABSOLUTE): 0.1 10*3/uL (ref 0.0–0.4)
Eos: 1 %
Hematocrit: 43 % (ref 34.0–46.6)
Hemoglobin: 14.1 g/dL (ref 11.1–15.9)
Immature Grans (Abs): 0 10*3/uL (ref 0.0–0.1)
Immature Granulocytes: 0 %
Lymphocytes Absolute: 2.5 10*3/uL (ref 0.7–3.1)
Lymphs: 30 %
MCH: 31 pg (ref 26.6–33.0)
MCHC: 32.8 g/dL (ref 31.5–35.7)
MCV: 95 fL (ref 79–97)
Monocytes Absolute: 0.8 10*3/uL (ref 0.1–0.9)
Monocytes: 10 %
Neutrophils Absolute: 4.6 10*3/uL (ref 1.4–7.0)
Neutrophils: 59 %
Platelets: 372 10*3/uL (ref 150–450)
RBC: 4.55 x10E6/uL (ref 3.77–5.28)
RDW: 13.2 % (ref 11.7–15.4)
WBC: 8.1 10*3/uL (ref 3.4–10.8)

## 2021-12-29 ENCOUNTER — Telehealth: Payer: Self-pay

## 2021-12-29 NOTE — Telephone Encounter (Signed)
Pt aware or provider feedback and voiced understanding. 

## 2021-12-29 NOTE — Telephone Encounter (Signed)
HEr labs looked great. Perfect, couldn't be better

## 2021-12-29 NOTE — Telephone Encounter (Signed)
Patient calling to find out lab results, please review and advise.

## 2022-01-06 ENCOUNTER — Telehealth: Payer: Self-pay | Admitting: Family Medicine

## 2022-01-06 ENCOUNTER — Telehealth: Payer: Self-pay

## 2022-01-06 NOTE — Telephone Encounter (Signed)
Pt was scheduled for AWV visit today at 10:30. Unable to reach patient by phone. Tried home and cell #s and both were out of service. Also tried husband, Bobby's, cell # and it is out of service also. Mjp,lpn

## 2022-01-06 NOTE — Telephone Encounter (Signed)
Copied from CRM [id number]. Topic: Medicare AWV >> Jan 06, 2022 11:09 AM Claudette Laws R wrote: Reason for CRM:   Left message for patient to call back and schedule Medicare Annual Wellness Visit (AWV) in office.   If unable to come into the office for AWV,  please offer to do virtually or by telephone.  No hx of AWV eligible for AWVI as of 11/05/2021 per [employer]  Please schedule at anytime with [facility] Health Advisor.      40 Minutes appointment   Any questions, please call me at [phone]

## 2022-01-20 ENCOUNTER — Encounter: Payer: Self-pay | Admitting: *Deleted

## 2022-01-29 ENCOUNTER — Encounter (INDEPENDENT_AMBULATORY_CARE_PROVIDER_SITE_OTHER): Payer: Self-pay

## 2022-01-29 ENCOUNTER — Other Ambulatory Visit: Payer: Self-pay

## 2022-01-29 ENCOUNTER — Encounter (INDEPENDENT_AMBULATORY_CARE_PROVIDER_SITE_OTHER): Payer: Medicare PPO | Admitting: Ophthalmology

## 2022-01-29 DIAGNOSIS — H00031 Abscess of right upper eyelid: Secondary | ICD-10-CM | POA: Diagnosis not present

## 2022-01-29 DIAGNOSIS — H401111 Primary open-angle glaucoma, right eye, mild stage: Secondary | ICD-10-CM | POA: Diagnosis not present

## 2022-01-29 DIAGNOSIS — H40051 Ocular hypertension, right eye: Secondary | ICD-10-CM | POA: Diagnosis not present

## 2022-02-02 ENCOUNTER — Telehealth: Payer: Self-pay | Admitting: Family Medicine

## 2022-02-02 NOTE — Telephone Encounter (Signed)
Left message for patient to call back and schedule Medicare Annual Wellness Visit (AWV) to be completed by video or phone.   Last AWV: 07/12/2019  Please schedule at anytime with [facility] Nurse Health Advisor   Chelsea Finley  45 minute appointment  Any questions, please contact me at [phone]

## 2022-02-26 DIAGNOSIS — H34831 Tributary (branch) retinal vein occlusion, right eye, with macular edema: Secondary | ICD-10-CM | POA: Diagnosis not present

## 2022-02-26 DIAGNOSIS — H401111 Primary open-angle glaucoma, right eye, mild stage: Secondary | ICD-10-CM | POA: Diagnosis not present

## 2022-02-26 DIAGNOSIS — H26492 Other secondary cataract, left eye: Secondary | ICD-10-CM | POA: Diagnosis not present

## 2022-02-26 DIAGNOSIS — H35033 Hypertensive retinopathy, bilateral: Secondary | ICD-10-CM | POA: Diagnosis not present

## 2022-03-11 DIAGNOSIS — H01021 Squamous blepharitis right upper eyelid: Secondary | ICD-10-CM | POA: Diagnosis not present

## 2022-03-11 DIAGNOSIS — H01024 Squamous blepharitis left upper eyelid: Secondary | ICD-10-CM | POA: Diagnosis not present

## 2022-03-11 DIAGNOSIS — H401111 Primary open-angle glaucoma, right eye, mild stage: Secondary | ICD-10-CM | POA: Diagnosis not present

## 2022-03-11 DIAGNOSIS — H34831 Tributary (branch) retinal vein occlusion, right eye, with macular edema: Secondary | ICD-10-CM | POA: Diagnosis not present

## 2022-04-13 DIAGNOSIS — H401111 Primary open-angle glaucoma, right eye, mild stage: Secondary | ICD-10-CM | POA: Diagnosis not present

## 2022-04-22 DIAGNOSIS — H401111 Primary open-angle glaucoma, right eye, mild stage: Secondary | ICD-10-CM | POA: Diagnosis not present

## 2022-05-08 ENCOUNTER — Other Ambulatory Visit: Payer: Self-pay | Admitting: Family Medicine

## 2022-05-08 DIAGNOSIS — E039 Hypothyroidism, unspecified: Secondary | ICD-10-CM

## 2022-05-19 DIAGNOSIS — H401111 Primary open-angle glaucoma, right eye, mild stage: Secondary | ICD-10-CM | POA: Diagnosis not present

## 2022-05-25 ENCOUNTER — Encounter (INDEPENDENT_AMBULATORY_CARE_PROVIDER_SITE_OTHER): Payer: Medicare PPO | Admitting: Ophthalmology

## 2022-05-25 DIAGNOSIS — H353132 Nonexudative age-related macular degeneration, bilateral, intermediate dry stage: Secondary | ICD-10-CM | POA: Diagnosis not present

## 2022-05-25 DIAGNOSIS — H348312 Tributary (branch) retinal vein occlusion, right eye, stable: Secondary | ICD-10-CM | POA: Diagnosis not present

## 2022-05-25 DIAGNOSIS — H43813 Vitreous degeneration, bilateral: Secondary | ICD-10-CM | POA: Diagnosis not present

## 2022-05-25 DIAGNOSIS — H35033 Hypertensive retinopathy, bilateral: Secondary | ICD-10-CM | POA: Diagnosis not present

## 2022-05-25 DIAGNOSIS — I10 Essential (primary) hypertension: Secondary | ICD-10-CM | POA: Diagnosis not present

## 2022-06-04 ENCOUNTER — Other Ambulatory Visit: Payer: Self-pay | Admitting: Family Medicine

## 2022-06-04 DIAGNOSIS — I10 Essential (primary) hypertension: Secondary | ICD-10-CM

## 2022-06-23 ENCOUNTER — Ambulatory Visit: Payer: Medicare PPO | Admitting: Family Medicine

## 2022-06-30 ENCOUNTER — Encounter: Payer: Self-pay | Admitting: Family Medicine

## 2022-06-30 ENCOUNTER — Ambulatory Visit: Payer: Medicare PPO | Admitting: Family Medicine

## 2022-06-30 VITALS — BP 144/84 | HR 82 | Temp 97.9°F | Ht 62.0 in | Wt 198.6 lb

## 2022-06-30 DIAGNOSIS — I10 Essential (primary) hypertension: Secondary | ICD-10-CM | POA: Diagnosis not present

## 2022-06-30 DIAGNOSIS — E7841 Elevated Lipoprotein(a): Secondary | ICD-10-CM

## 2022-06-30 DIAGNOSIS — E039 Hypothyroidism, unspecified: Secondary | ICD-10-CM

## 2022-06-30 MED ORDER — PRAVASTATIN SODIUM 80 MG PO TABS: 80.0000 mg | ORAL_TABLET | Freq: Every day | ORAL | 3 refills | Status: DC

## 2022-06-30 MED ORDER — SYNTHROID 125 MCG PO TABS: ORAL_TABLET | ORAL | 3 refills | Status: DC

## 2022-06-30 MED ORDER — LISINOPRIL 20 MG PO TABS: ORAL_TABLET | ORAL | 3 refills | Status: DC

## 2022-06-30 MED ORDER — AMLODIPINE BESYLATE 5 MG PO TABS: ORAL_TABLET | ORAL | 3 refills | Status: DC

## 2022-06-30 MED ORDER — MECLIZINE HCL 50 MG PO TABS
50.0000 mg | ORAL_TABLET | Freq: Three times a day (TID) | ORAL | 0 refills | Status: DC | PRN
Start: 2022-06-30 — End: 2023-06-28

## 2022-06-30 NOTE — Progress Notes (Signed)
Subjective:  Patient ID: Chelsea Finley, female    DOB: [birth year]  Age: 81 y.o. MRN: [id number]  CC: Medical Management of Chronic Issues   HPI Chelsea Finley presents for  follow-up on  thyroid. The patient has a history of hypothyroidism for many years. It has been stable recently. Pt. denies any change in  voice, loss of hair, heat or cold intolerance. Energy level has been adequate to good. Patient denies constipation and diarrhea. No myxedema. Medication is as noted below. Verified that pt is taking it daily on an empty stomach. Well tolerated.  follow-up of hypertension. Patient has no history of headache chest pain or shortness of breath or recent cough. Patient also denies symptoms of TIA such as focal numbness or weakness. Patient denies side effects from medication. States taking it regularly.   History Chelsea Finley has a past medical history of Arthritis of knee, left (02/01/2012), Elevated lipoprotein(a) (12/16/2019), Hyperlipidemia, and Hypertension.   She has a past surgical history that includes Rotator cuff repair (05/2010); Knee Arthroplasty (11/16/2011); Appendectomy; Tubal ligation; and Knee Arthroplasty (02/01/2012).   Her family history includes Cancer in her father; Depression in her mother.She reports that she has never smoked. She has never used smokeless tobacco. She reports that she does not drink alcohol and does not use drugs.  Current Outpatient Medications on File Prior to Visit  Medication Sig Dispense Refill   mupirocin cream (BACTROBAN) 2 % Apply 1 application topically 2 (two) times daily. (Patient not taking: Reported on 12/25/2020) 30 g 0   No current facility-administered medications on file prior to visit.    ROS Review of Systems  Constitutional: Negative.   HENT: Negative.    Eyes:  Positive for visual disturbance.  Respiratory:  Negative for shortness of breath.   Cardiovascular:  Negative for chest pain.  Gastrointestinal:  Negative for abdominal  pain.  Musculoskeletal:  Negative for arthralgias.  Neurological:  Positive for dizziness (since laser surgery a few weeks ago, room moves).    Objective:  BP (!) 144/84   Pulse 82   Temp 97.9 F (36.6 C)   Ht 5' 2"  (1.575 m)   Wt 198 lb 9.6 oz (90.1 kg)   SpO2 98%   BMI 36.32 kg/m   BP Readings from Last 3 Encounters:  06/30/22 (!) 144/84  12/24/21 (!) 161/76  06/25/21 (!) 152/94    Wt Readings from Last 3 Encounters:  06/30/22 198 lb 9.6 oz (90.1 kg)  12/24/21 206 lb 6.4 oz (93.6 kg)  06/25/21 208 lb 9.6 oz (94.6 kg)     Physical Exam Constitutional:      General: She is not in acute distress.    Appearance: She is well-developed.  Cardiovascular:     Rate and Rhythm: Normal rate and regular rhythm.  Pulmonary:     Breath sounds: Normal breath sounds.  Musculoskeletal:        General: Normal range of motion.  Skin:    General: Skin is warm and dry.  Neurological:     Mental Status: She is alert and oriented to person, place, and time.       Assessment & Plan:   Ladeana was seen today for medical management of chronic issues.  Diagnoses and all orders for this visit:  Hypothyroidism, unspecified type -     TSH + free T4 -     SYNTHROID 125 MCG tablet; TAKE ONE TABLET EACH MORNING BEFORE BREAKFAST  Essential hypertension -  CBC with Differential/Platelet -     CMP14+EGFR -     amLODipine (NORVASC) 5 MG tablet; TAKE ONE (1) TABLET EACH DAY -     lisinopril (ZESTRIL) 20 MG tablet; TAKE ONE (1) TABLET EACH DAY  Elevated lipoprotein(a) -     Lipid panel -     pravastatin (PRAVACHOL) 80 MG tablet; Take 1 tablet (80 mg total) by mouth daily.  Other orders -     meclizine (ANTIVERT) 50 MG tablet; Take 1 tablet (50 mg total) by mouth 3 (three) times daily as needed.   Allergies as of 06/30/2022       Reactions   Morphine And Related Nausea And Vomiting        Medication List        Accurate as of June 30, 2022  8:32 AM. If you have any  questions, ask your nurse or doctor.          STOP taking these medications    brimonidine 0.2 % ophthalmic solution Commonly known as: ALPHAGAN Stopped by: Claretta Fraise, MD   dorzolamide 2 % ophthalmic solution Commonly known as: TRUSOPT Stopped by: Claretta Fraise, MD   dorzolamide-timolol 22.3-6.8 MG/ML ophthalmic solution Commonly known as: COSOPT Stopped by: Claretta Fraise, MD   latanoprost 0.005 % ophthalmic solution Commonly known as: XALATAN Stopped by: Claretta Fraise, MD       TAKE these medications    amLODipine 5 MG tablet Commonly known as: NORVASC TAKE ONE (1) TABLET EACH DAY   lisinopril 20 MG tablet Commonly known as: ZESTRIL TAKE ONE (1) TABLET EACH DAY   meclizine 50 MG tablet Commonly known as: ANTIVERT Take 1 tablet (50 mg total) by mouth 3 (three) times daily as needed. Started by: Claretta Fraise, MD   mupirocin cream 2 % Commonly known as: BACTROBAN Apply 1 application topically 2 (two) times daily.   pravastatin 80 MG tablet Commonly known as: PRAVACHOL Take 1 tablet (80 mg total) by mouth daily.   Synthroid 125 MCG tablet Generic drug: levothyroxine TAKE ONE TABLET EACH MORNING BEFORE BREAKFAST        Meds ordered this encounter  Medications   amLODipine (NORVASC) 5 MG tablet    Sig: TAKE ONE (1) TABLET EACH DAY    Dispense:  90 tablet    Refill:  3   lisinopril (ZESTRIL) 20 MG tablet    Sig: TAKE ONE (1) TABLET EACH DAY    Dispense:  90 tablet    Refill:  3   pravastatin (PRAVACHOL) 80 MG tablet    Sig: Take 1 tablet (80 mg total) by mouth daily.    Dispense:  90 tablet    Refill:  3   SYNTHROID 125 MCG tablet    Sig: TAKE ONE TABLET EACH MORNING BEFORE BREAKFAST    Dispense:  90 tablet    Refill:  3   meclizine (ANTIVERT) 50 MG tablet    Sig: Take 1 tablet (50 mg total) by mouth 3 (three) times daily as needed.    Dispense:  30 tablet    Refill:  0      Follow-up: Return in about 6 months (around  12/31/2022).  Claretta Fraise, M.D.

## 2022-07-01 LAB — CMP14+EGFR
ALT: 18 IU/L (ref 0–32)
AST: 25 IU/L (ref 0–40)
Albumin/Globulin Ratio: 1.7 (ref 1.2–2.2)
Albumin: 4.4 g/dL (ref 3.7–4.7)
Alkaline Phosphatase: 67 IU/L (ref 44–121)
BUN/Creatinine Ratio: 13 (ref 12–28)
BUN: 12 mg/dL (ref 8–27)
Bilirubin Total: 0.3 mg/dL (ref 0.0–1.2)
CO2: 21 mmol/L (ref 20–29)
Calcium: 9.3 mg/dL (ref 8.7–10.3)
Chloride: 105 mmol/L (ref 96–106)
Creatinine, Ser: 0.91 mg/dL (ref 0.57–1.00)
Globulin, Total: 2.6 g/dL (ref 1.5–4.5)
Glucose: 97 mg/dL (ref 70–99)
Potassium: 4 mmol/L (ref 3.5–5.2)
Sodium: 140 mmol/L (ref 134–144)
Total Protein: 7 g/dL (ref 6.0–8.5)
eGFR: 63 mL/min/{1.73_m2} (ref >59)

## 2022-07-01 LAB — CBC WITH DIFFERENTIAL/PLATELET
Basophils Absolute: 0 10*3/uL (ref 0.0–0.2)
Basos: 0 %
EOS (ABSOLUTE): 0.1 10*3/uL (ref 0.0–0.4)
Eos: 2 %
Hematocrit: 41 % (ref 34.0–46.6)
Hemoglobin: 14 g/dL (ref 11.1–15.9)
Immature Grans (Abs): 0 10*3/uL (ref 0.0–0.1)
Immature Granulocytes: 0 %
Lymphocytes Absolute: 2.2 10*3/uL (ref 0.7–3.1)
Lymphs: 40 %
MCH: 32 pg (ref 26.6–33.0)
MCHC: 34.1 g/dL (ref 31.5–35.7)
MCV: 94 fL (ref 79–97)
Monocytes Absolute: 0.6 10*3/uL (ref 0.1–0.9)
Monocytes: 11 %
Neutrophils Absolute: 2.6 10*3/uL (ref 1.4–7.0)
Neutrophils: 47 %
Platelets: 338 10*3/uL (ref 150–450)
RBC: 4.38 x10E6/uL (ref 3.77–5.28)
RDW: 13.3 % (ref 11.7–15.4)
WBC: 5.6 10*3/uL (ref 3.4–10.8)

## 2022-07-01 LAB — TSH+FREE T4
Free T4: 1.59 ng/dL (ref 0.82–1.77)
TSH: 2.1 u[IU]/mL (ref 0.450–4.500)

## 2022-07-01 LAB — LIPID PANEL
Chol/HDL Ratio: 2.6 ratio (ref 0.0–4.4)
Cholesterol, Total: 159 mg/dL (ref 100–199)
HDL: 62 mg/dL (ref >39)
LDL Chol Calc (NIH): 83 mg/dL (ref 0–99)
Triglycerides: 71 mg/dL (ref 0–149)
VLDL Cholesterol Cal: 14 mg/dL (ref 5–40)

## 2022-07-01 NOTE — Progress Notes (Signed)
Hello Josslynn,  Your lab result is normal and/or stable.Some minor variations that are not significant are commonly marked abnormal, but do not represent any medical problem for you.  Best regards, Valari Taylor, M.D.

## 2022-07-02 ENCOUNTER — Encounter (INDEPENDENT_AMBULATORY_CARE_PROVIDER_SITE_OTHER): Payer: Medicare PPO | Admitting: Ophthalmology

## 2022-07-02 DIAGNOSIS — H43813 Vitreous degeneration, bilateral: Secondary | ICD-10-CM

## 2022-07-02 DIAGNOSIS — H35033 Hypertensive retinopathy, bilateral: Secondary | ICD-10-CM | POA: Diagnosis not present

## 2022-07-02 DIAGNOSIS — I10 Essential (primary) hypertension: Secondary | ICD-10-CM

## 2022-07-02 DIAGNOSIS — H348312 Tributary (branch) retinal vein occlusion, right eye, stable: Secondary | ICD-10-CM | POA: Diagnosis not present

## 2022-07-02 DIAGNOSIS — H353132 Nonexudative age-related macular degeneration, bilateral, intermediate dry stage: Secondary | ICD-10-CM

## 2022-08-13 ENCOUNTER — Encounter (INDEPENDENT_AMBULATORY_CARE_PROVIDER_SITE_OTHER): Payer: Medicare PPO | Admitting: Ophthalmology

## 2022-08-13 DIAGNOSIS — H353132 Nonexudative age-related macular degeneration, bilateral, intermediate dry stage: Secondary | ICD-10-CM

## 2022-08-13 DIAGNOSIS — I10 Essential (primary) hypertension: Secondary | ICD-10-CM

## 2022-08-13 DIAGNOSIS — H43813 Vitreous degeneration, bilateral: Secondary | ICD-10-CM

## 2022-08-13 DIAGNOSIS — H35033 Hypertensive retinopathy, bilateral: Secondary | ICD-10-CM | POA: Diagnosis not present

## 2022-08-13 DIAGNOSIS — H348312 Tributary (branch) retinal vein occlusion, right eye, stable: Secondary | ICD-10-CM

## 2022-08-20 DIAGNOSIS — H401111 Primary open-angle glaucoma, right eye, mild stage: Secondary | ICD-10-CM | POA: Diagnosis not present

## 2022-09-29 ENCOUNTER — Ambulatory Visit: Payer: Medicare PPO

## 2022-10-01 ENCOUNTER — Encounter (INDEPENDENT_AMBULATORY_CARE_PROVIDER_SITE_OTHER): Payer: Medicare PPO | Admitting: Ophthalmology

## 2022-10-01 DIAGNOSIS — H348312 Tributary (branch) retinal vein occlusion, right eye, stable: Secondary | ICD-10-CM | POA: Diagnosis not present

## 2022-10-01 DIAGNOSIS — H35033 Hypertensive retinopathy, bilateral: Secondary | ICD-10-CM

## 2022-10-01 DIAGNOSIS — I10 Essential (primary) hypertension: Secondary | ICD-10-CM | POA: Diagnosis not present

## 2022-10-01 DIAGNOSIS — H353132 Nonexudative age-related macular degeneration, bilateral, intermediate dry stage: Secondary | ICD-10-CM

## 2022-10-01 DIAGNOSIS — H43813 Vitreous degeneration, bilateral: Secondary | ICD-10-CM

## 2022-11-19 ENCOUNTER — Encounter (INDEPENDENT_AMBULATORY_CARE_PROVIDER_SITE_OTHER): Payer: Medicare PPO | Admitting: Ophthalmology

## 2022-11-19 DIAGNOSIS — H348312 Tributary (branch) retinal vein occlusion, right eye, stable: Secondary | ICD-10-CM

## 2022-11-19 DIAGNOSIS — H35033 Hypertensive retinopathy, bilateral: Secondary | ICD-10-CM

## 2022-11-19 DIAGNOSIS — H43813 Vitreous degeneration, bilateral: Secondary | ICD-10-CM | POA: Diagnosis not present

## 2022-11-19 DIAGNOSIS — H353132 Nonexudative age-related macular degeneration, bilateral, intermediate dry stage: Secondary | ICD-10-CM

## 2022-11-19 DIAGNOSIS — I10 Essential (primary) hypertension: Secondary | ICD-10-CM | POA: Diagnosis not present

## 2022-12-23 ENCOUNTER — Ambulatory Visit: Payer: Medicare PPO | Admitting: Family Medicine

## 2022-12-23 ENCOUNTER — Encounter: Payer: Self-pay | Admitting: Family Medicine

## 2022-12-23 VITALS — BP 129/79 | HR 79 | Temp 97.8°F | Ht 62.0 in | Wt 191.0 lb

## 2022-12-23 DIAGNOSIS — R200 Anesthesia of skin: Secondary | ICD-10-CM | POA: Diagnosis not present

## 2022-12-23 DIAGNOSIS — E039 Hypothyroidism, unspecified: Secondary | ICD-10-CM

## 2022-12-23 DIAGNOSIS — R202 Paresthesia of skin: Secondary | ICD-10-CM

## 2022-12-23 DIAGNOSIS — I10 Essential (primary) hypertension: Secondary | ICD-10-CM

## 2022-12-23 DIAGNOSIS — E782 Mixed hyperlipidemia: Secondary | ICD-10-CM

## 2022-12-23 NOTE — Progress Notes (Signed)
Subjective:  Patient ID: Chelsea Finley, female    DOB: 08-Sep-1941  Age: 82 y.o. MRN: [id number]  CC: Medical Management of Chronic Issues  ` HPI Chelsea Finley presents for  follow-up on  thyroid. The patient has a history of hypothyroidism for many years. It has been stable recently. Pt. denies any change in  voice, loss of hair, heat or cold intolerance. Energy level has been adequate to good. Patient denies constipation and diarrhea. No myxedema. Medication is as noted below. Verified that pt is taking it daily on an empty stomach. Well tolerated.    Arms and hands are numb. Hands are terrible. Onset 2-4 weeks ago. Equal bilaterally. Feeling dizziness as well.        12/23/2022    8:07 AM 06/30/2022    8:16 AM 06/30/2022    8:14 AM  Depression screen PHQ 2/9  Decreased Interest 0 0 0  Down, Depressed, Hopeless 0 0 0  PHQ - 2 Score 0 0 0  Altered sleeping 0 0   Tired, decreased energy 0 1   Change in appetite 0 0   Feeling bad or failure about yourself  0 0   Trouble concentrating 0 0   Moving slowly or fidgety/restless 0 0   Suicidal thoughts 0 0   PHQ-9 Score 0 1   Difficult doing work/chores Not difficult at all Not difficult at all     History Chelsea Finley has a past medical history of Arthritis of knee, left (02/01/2012), Elevated lipoprotein(a) (12/16/2019), Hyperlipidemia, and Hypertension.   She has a past surgical history that includes Rotator cuff repair (05/2010); Knee Arthroplasty (11/16/2011); Appendectomy; Tubal ligation; and Knee Arthroplasty (02/01/2012).   Her family history includes Cancer in her father; Depression in her mother.She reports that she has never smoked. She has never used smokeless tobacco. She reports that she does not drink alcohol and does not use drugs.    ROS Review of Systems  Constitutional: Negative.   HENT: Negative.    Eyes:  Negative for visual disturbance.  Respiratory:  Negative for shortness of breath.   Cardiovascular:   Negative for chest pain.  Gastrointestinal:  Negative for abdominal pain.  Musculoskeletal:  Negative for arthralgias.    Objective:  BP 129/79   Pulse 79   Temp 97.8 F (36.6 C)   Ht 5\' 2"  (1.575 m)   Wt 191 lb (86.6 kg)   SpO2 96%   BMI 34.93 kg/m   BP Readings from Last 3 Encounters:  12/23/22 129/79  06/30/22 (!) 144/84  12/24/21 (!) 161/76    Wt Readings from Last 3 Encounters:  12/23/22 191 lb (86.6 kg)  06/30/22 198 lb 9.6 oz (90.1 kg)  12/24/21 206 lb 6.4 oz (93.6 kg)     Physical Exam Constitutional:      General: She is not in acute distress.    Appearance: She is well-developed.  Cardiovascular:     Rate and Rhythm: Normal rate and regular rhythm.  Pulmonary:     Breath sounds: Normal breath sounds.  Musculoskeletal:        General: Normal range of motion.  Skin:    General: Skin is warm and dry.  Neurological:     Mental Status: She is alert and oriented to person, place, and time.     Sensory: Sensory deficit present.       Assessment & Plan:   Chelsea Finley was seen today for medical management of chronic issues.  Diagnoses and all orders  for this visit:  Hypothyroidism, unspecified type -     TSH + free T4  Essential hypertension -     CBC with Differential/Platelet -     CMP14+EGFR  Mixed hyperlipidemia -     Lipid panel  Numbness and tingling in both hands -     Ambulatory referral to Neurology -     CBC with Differential/Platelet -     CMP14+EGFR -     TSH + free T4 -     C-reactive protein -     Sedimentation Rate       I am having Chelsea Finley. Chelsea Finley maintain her mupirocin cream, amLODipine, lisinopril, pravastatin, Synthroid, and meclizine.  Allergies as of 12/23/2022       Reactions   Morphine And Related Nausea And Vomiting        Medication List        Accurate as of December 23, 2022  8:45 AM. If you have any questions, ask your nurse or doctor.          amLODipine 5 MG tablet Commonly known as:  NORVASC TAKE ONE (1) TABLET EACH DAY   lisinopril 20 MG tablet Commonly known as: ZESTRIL TAKE ONE (1) TABLET EACH DAY   meclizine 50 MG tablet Commonly known as: ANTIVERT Take 1 tablet (50 mg total) by mouth 3 (three) times daily as needed.   mupirocin cream 2 % Commonly known as: BACTROBAN Apply 1 application topically 2 (two) times daily.   pravastatin 80 MG tablet Commonly known as: PRAVACHOL Take 1 tablet (80 mg total) by mouth daily.   Synthroid 125 MCG tablet Generic drug: levothyroxine TAKE ONE TABLET EACH MORNING BEFORE BREAKFAST         Follow-up: No follow-ups on file.  Chelsea Finley, M.D.

## 2022-12-24 LAB — LIPID PANEL
Chol/HDL Ratio: 2.9 ratio (ref 0.0–4.4)
Cholesterol, Total: 177 mg/dL (ref 100–199)
HDL: 62 mg/dL (ref >39)
LDL Chol Calc (NIH): 102 mg/dL — ABNORMAL HIGH (ref 0–99)
Triglycerides: 71 mg/dL (ref 0–149)
VLDL Cholesterol Cal: 13 mg/dL (ref 5–40)

## 2022-12-24 LAB — CBC WITH DIFFERENTIAL/PLATELET
Basophils Absolute: 0 10*3/uL (ref 0.0–0.2)
Basos: 0 %
EOS (ABSOLUTE): 0.1 10*3/uL (ref 0.0–0.4)
Eos: 2 %
Hematocrit: 42.1 % (ref 34.0–46.6)
Hemoglobin: 14.4 g/dL (ref 11.1–15.9)
Immature Grans (Abs): 0 10*3/uL (ref 0.0–0.1)
Immature Granulocytes: 0 %
Lymphocytes Absolute: 2.5 10*3/uL (ref 0.7–3.1)
Lymphs: 41 %
MCH: 32.1 pg (ref 26.6–33.0)
MCHC: 34.2 g/dL (ref 31.5–35.7)
MCV: 94 fL (ref 79–97)
Monocytes Absolute: 0.6 10*3/uL (ref 0.1–0.9)
Monocytes: 10 %
Neutrophils Absolute: 2.8 10*3/uL (ref 1.4–7.0)
Neutrophils: 47 %
Platelets: 362 10*3/uL (ref 150–450)
RBC: 4.49 x10E6/uL (ref 3.77–5.28)
RDW: 12.3 % (ref 11.7–15.4)
WBC: 6.1 10*3/uL (ref 3.4–10.8)

## 2022-12-24 LAB — CMP14+EGFR
ALT: 20 IU/L (ref 0–32)
AST: 29 IU/L (ref 0–40)
Albumin/Globulin Ratio: 1.8 (ref 1.2–2.2)
Albumin: 4.5 g/dL (ref 3.7–4.7)
Alkaline Phosphatase: 78 IU/L (ref 44–121)
BUN/Creatinine Ratio: 16 (ref 12–28)
BUN: 13 mg/dL (ref 8–27)
Bilirubin Total: 0.3 mg/dL (ref 0.0–1.2)
CO2: 20 mmol/L (ref 20–29)
Calcium: 9.5 mg/dL (ref 8.7–10.3)
Chloride: 101 mmol/L (ref 96–106)
Creatinine, Ser: 0.8 mg/dL (ref 0.57–1.00)
Globulin, Total: 2.5 g/dL (ref 1.5–4.5)
Glucose: 96 mg/dL (ref 70–99)
Potassium: 3.8 mmol/L (ref 3.5–5.2)
Sodium: 137 mmol/L (ref 134–144)
Total Protein: 7 g/dL (ref 6.0–8.5)
eGFR: 73 mL/min/{1.73_m2} (ref >59)

## 2022-12-24 LAB — SEDIMENTATION RATE: Sed Rate: 6 mm/hr (ref 0–40)

## 2022-12-24 LAB — TSH+FREE T4
Free T4: 1.47 ng/dL (ref 0.82–1.77)
TSH: 1.06 u[IU]/mL (ref 0.450–4.500)

## 2022-12-24 LAB — C-REACTIVE PROTEIN: CRP: <1 mg/L (ref 0–10)

## 2022-12-24 NOTE — Progress Notes (Signed)
Hello Jarelis,  Your lab result is normal and/or stable.Some minor variations that are not significant are commonly marked abnormal, but do not represent any medical problem for you.  Best regards, Mazella Deen, M.D.

## 2022-12-28 ENCOUNTER — Telehealth: Payer: Self-pay | Admitting: Family Medicine

## 2022-12-28 NOTE — Telephone Encounter (Signed)
REFERRAL REQUEST Telephone Note  Have you been seen at our office for this problem? yes (Advise that they may need an appointment with their PCP before a referral can be done)  Reason for Referral: off balance? Referral discussed with patient: yes  Best contact number of patient for referral team: [phone]    Has patient been seen by a specialist for this issue before: no  Patient provider preference for referral: no Patient location preference for referral: [place]   Patient notified that referrals can take up to a week or longer to process. If they haven't heard anything within a week they should call back and speak with the referral department.    Please call her husband.

## 2022-12-30 ENCOUNTER — Encounter: Payer: Self-pay | Admitting: Neurology

## 2022-12-30 ENCOUNTER — Ambulatory Visit: Payer: Medicare PPO | Admitting: Neurology

## 2022-12-30 ENCOUNTER — Other Ambulatory Visit: Payer: Self-pay | Admitting: Family Medicine

## 2022-12-30 VITALS — BP 150/79 | HR 83 | Ht 62.0 in | Wt 190.0 lb

## 2022-12-30 DIAGNOSIS — R29898 Other symptoms and signs involving the musculoskeletal system: Secondary | ICD-10-CM

## 2022-12-30 DIAGNOSIS — G959 Disease of spinal cord, unspecified: Secondary | ICD-10-CM

## 2022-12-30 DIAGNOSIS — R269 Unspecified abnormalities of gait and mobility: Secondary | ICD-10-CM

## 2022-12-30 DIAGNOSIS — R200 Anesthesia of skin: Secondary | ICD-10-CM

## 2022-12-30 DIAGNOSIS — R2689 Other abnormalities of gait and mobility: Secondary | ICD-10-CM

## 2022-12-30 NOTE — Telephone Encounter (Signed)
Referral placed, as requested WS 

## 2022-12-30 NOTE — Progress Notes (Signed)
[facility]  PATIENT: Chelsea Finley DOB: [birth year]  REFERRING DOCTOR OR PCP: Claretta Fraise MD SOURCE: Patient/family, Notes from primary care, imaging and lab reports, MRI of the brain from 2015 personally reviewed  _________________________________   HISTORICAL  CHIEF COMPLAINT:  Chief Complaint  Patient presents with   New Patient (Initial Visit)    Patient is here in room 79 with daughter, husband. New patient internal referral numbness and tingling in both hands for 6 months now. Poor unbalance greater than 6 months. Eye appointment scheduled in Feb. No vision problems     HISTORY OF PRESENT ILLNESS:  I had the pleasure seeing your patient, Chelsea Finley, at [facility] for neurologic consultation regarding the numbness and tingling in both of her hands for the past 6 months and reduced balance.  She is an 82 year old woman who had the gradual onset of left > right hand numbness starting 6 months ago and progressively worsening.     She also notes weakness in hands and shoulders.   She feels all the fingers are numb.     When she lays down at night, she feels she does better.  Tingling is uncomfortable but not painful.      Gait is worse due to poor balance more than weakness.    Balance is worse when dark.    She notes urinary urgency but not incontinence.   Of note, she reports the gait seemed to worsen after laser surgery.  She was in the seated position.      She notes only mild stable neck pain and good ROM.     In 2015, she had very elevated BP and had a brain MRI showing a very small thalamc lacunar infarction.   Imaging: MRI of the brain 10/11/2014 showed normal brain volume.  There was a small lacunar infarction in the right thalamus.  Elsewhere, there was very minimal chronic microvascular ischemic change.  No acute findings.  Carotid ultrasound 12/12/2014 showed mild plaque in the carotid bulbs/proximal internal carotid  arteries left greater than right but not hemodynamically significant.  REVIEW OF SYSTEMS: Constitutional: No fevers, chills, sweats, or change in appetite Eyes: No visual changes, double vision, eye pain Ear, nose and throat: No hearing loss, ear pain, nasal congestion, sore throat Cardiovascular: No chest pain, palpitations Respiratory:  No shortness of breath at rest or with exertion.   No wheezes GastrointestinaI: No nausea, vomiting, diarrhea, abdominal pain, fecal incontinence Genitourinary:  No dysuria, urinary retention or frequency.  No nocturia. Musculoskeletal:  No neck pain, back pain Integumentary: No rash, pruritus, skin lesions Neurological: as above Psychiatric: No depression at this time.  No anxiety Endocrine: No palpitations, diaphoresis, change in appetite, change in weigh or increased thirst Hematologic/Lymphatic:  No anemia, purpura, petechiae. Allergic/Immunologic: No itchy/runny eyes, nasal congestion, recent allergic reactions, rashes  ALLERGIES: Allergies  Allergen Reactions   Morphine And Related Nausea And Vomiting    HOME MEDICATIONS:  Current Outpatient Medications:    amLODipine (NORVASC) 5 MG tablet, TAKE ONE (1) TABLET EACH DAY, Disp: 90 tablet, Rfl: 3   lisinopril (ZESTRIL) 20 MG tablet, TAKE ONE (1) TABLET EACH DAY, Disp: 90 tablet, Rfl: 3   pravastatin (PRAVACHOL) 80 MG tablet, Take 1 tablet (80 mg total) by mouth daily., Disp: 90 tablet, Rfl: 3   SYNTHROID 125 MCG tablet, TAKE ONE TABLET EACH MORNING BEFORE BREAKFAST, Disp: 90 tablet, Rfl: 3   meclizine (ANTIVERT) 50 MG tablet, Take 1 tablet (50 mg total)  by mouth 3 (three) times daily as needed. (Patient not taking: Reported on 12/30/2022), Disp: 30 tablet, Rfl: 0   mupirocin cream (BACTROBAN) 2 %, Apply 1 application topically 2 (two) times daily. (Patient not taking: Reported on 12/30/2022), Disp: 30 g, Rfl: 0  PAST MEDICAL HISTORY: Past Medical History:  Diagnosis Date   Arthritis of knee,  left 02/01/2012   Elevated lipoprotein(a) 12/16/2019   Hyperlipidemia    Hypertension     PAST SURGICAL HISTORY: Past Surgical History:  Procedure Laterality Date   APPENDECTOMY     KNEE ARTHROPLASTY  11/16/2011   Procedure: COMPUTER ASSISTED TOTAL KNEE ARTHROPLASTY;  Surgeon: Kathryne Hitch;  Location: [facility] OR;  Service: Orthopedics;  Laterality: Right;  Right total knee arthroplasty   KNEE ARTHROPLASTY  02/01/2012   Procedure: COMPUTER ASSISTED TOTAL KNEE ARTHROPLASTY;  Surgeon: Kathryne Hitch, MD;  Location: [facility] OR;  Service: Orthopedics;  Laterality: Left;  Left total knee arthroplasty   ROTATOR CUFF REPAIR  05/2010   TUBAL LIGATION      FAMILY HISTORY: Family History  Problem Relation Age of Onset   Depression Mother    Cancer Father     SOCIAL HISTORY: Social History   Socioeconomic History   Marital status: Married    Spouse name: Fabiola Backer    Number of children: 4   Years of education: 12   Highest education level: 12th grade  Occupational History   Occupation: Retired   Tobacco Use   Smoking status: Never   Smokeless tobacco: Never  Vaping Use   Vaping Use: Never used  Substance and Sexual Activity   Alcohol use: No   Drug use: No   Sexual activity: Not Currently  Other Topics Concern   Not on file  Social History Narrative   Right handed   Drinks coffee in am   Wears Rx glasses    Social Determinants of Health   Financial Resource Strain: Low Risk  (07/12/2019)   Overall Financial Resource Strain (CARDIA)    Difficulty of Paying Living Expenses: Not hard at all  Food Insecurity: No Food Insecurity (07/12/2019)   Hunger Vital Sign    Worried About Running Out of Food in the Last Year: Never true    Ran Out of Food in the Last Year: Never true  Transportation Needs: No Transportation Needs (07/12/2019)   PRAPARE - Administrator, Civil Service (Medical): No    Lack of Transportation (Non-Medical): No  Physical Activity: Inactive  (07/12/2019)   Exercise Vital Sign    Days of Exercise per Week: 0 days    Minutes of Exercise per Session: 0 min  Stress: No Stress Concern Present (07/12/2019)   [employer] of Occupational Health - Occupational Stress Questionnaire    Feeling of Stress : Not at all  Social Connections: Socially Integrated (07/12/2019)   Social Connection and Isolation Panel [NHANES]    Frequency of Communication with Friends and Family: More than three times a week    Frequency of Social Gatherings with Friends and Family: More than three times a week    Attends Religious Services: More than 4 times per year    Active Member of [employer] or Organizations: Yes    Attends Banker Meetings: More than 4 times per year    Marital Status: Married  Catering manager Violence: Not At Risk (07/12/2019)   Humiliation, Afraid, Rape, and Kick questionnaire    Fear of Current or Ex-Partner: No  Emotionally Abused: No    Physically Abused: No    Sexually Abused: No       PHYSICAL EXAM  Vitals:   12/30/22 1525  BP: (!) 150/79  Pulse: 83  Weight: 190 lb (86.2 kg)  Height: 5\' 2"  (1.575 m)    Body mass index is 34.75 kg/m.   General: The patient is well-developed and well-nourished and in no acute distress  HEENT:  Head is [place]/AT.  Sclera are anicteric.    Neck: No carotid bruits are noted.  The neck is nontender.  Range of motion was normal  Cardiovascular: The heart has a regular rate and rhythm with a normal S1 and S2. There were no murmurs, gallops or rubs.    Skin: Extremities are without rash or  edema.  Musculoskeletal:  Back is nontender  Neurologic Exam  Mental status: The patient is alert and oriented x 3 at the time of the examination. The patient has apparent normal recent and remote memory, with an apparently normal attention span and concentration ability.   Speech is normal.  Cranial nerves: Extraocular movements are full. Pupils are equal, round, and reactive to light  and accomodation.  Visual fields are full.  Facial symmetry is present. There is good facial sensation to soft touch bilaterally.Facial strength is normal.  Trapezius and sternocleidomastoid strength is normal. No dysarthria is noted.  The tongue is midline, and the patient has symmetric elevation of the soft palate. No obvious hearing deficits are noted.  Motor:  Muscle bulk is normal.   Strength is reduced in the arms as follows (right/left): Deltoid 5-/3, triceps 4+/4, biceps 4+/4 -, wrist flexors 5/5, wrist extensors 5/4+, finger flexors 5/5, finger extensors 4+/4+, FDI 05/5, APB4+/4+.  Strength was normal in the legs today  Sensory: Sensory testing is intact to pinprick, soft touch and vibration sensation in all 4 extremities.  No Tinel signs at the elbows or wrists.  Coordination: Cerebellar testing reveals good finger-nose-finger and heel-to-shin bilaterally.  Gait and station: Station is normal.   The gait is wide with reduced stride.  The turn is unbalanced.  Romberg was borderline.  Reflexes: Deep tendon reflexes are symmetric and reduced in the left biceps and triceps relative to the right.  2+ right knee, 3 left knee, trace ankles.   Plantar responses are flexor.    DIAGNOSTIC DATA (LABS, IMAGING, TESTING) - I reviewed patient records, labs, notes, testing and imaging myself where available.  Lab Results  Component Value Date   WBC 6.1 12/23/2022   HGB 14.4 12/23/2022   HCT 42.1 12/23/2022   MCV 94 12/23/2022   PLT 362 12/23/2022      Component Value Date/Time   NA 137 12/23/2022 0852   K 3.8 12/23/2022 0852   CL 101 12/23/2022 0852   CO2 20 12/23/2022 0852   GLUCOSE 96 12/23/2022 0852   GLUCOSE 103 (H) 10/11/2014 1520   BUN 13 12/23/2022 0852   CREATININE 0.80 12/23/2022 0852   CALCIUM 9.5 12/23/2022 0852   PROT 7.0 12/23/2022 0852   ALBUMIN 4.5 12/23/2022 0852   AST 29 12/23/2022 0852   ALT 20 12/23/2022 0852   ALKPHOS 78 12/23/2022 0852   BILITOT 0.3 12/23/2022  0852   GFRNONAA 61 12/25/2020 0914   GFRAA 70 12/25/2020 0914      Lab Results  Component Value Date   TSH 1.060 12/23/2022       ASSESSMENT AND PLAN  Cervical myelopathy (HCC) - Plan: MR CERVICAL SPINE WO CONTRAST  Arm weakness - Plan: MR CERVICAL SPINE WO CONTRAST  Arm numbness - Plan: MR CERVICAL SPINE WO CONTRAST  Gait disturbance - Plan: MR CERVICAL SPINE WO CONTRAST  In summary, Ms. Johnstone is an 82 year old woman who has had a 54-month history of unsteady gait and left greater than right arm weakness and numbness.  Arm strength was most reduced in the left C5/C6 innervated muscles, to lesser extent C7 bilaterally.  I am most concerned about significant cervical degenerative changes causing radiculopathy and possible myelopathy.  Therefore we need to check an MRI of the cervical spine.  Based on the results she may need referral to neurosurgery.  If the MRI is normal for age, consider NCV/EMG testing for further evaluation.  She will return to see me as needed based on the results of the studies and whether or not referral is necessary.    Thank you for asking to see Ms. Rio.  Please let her know my to be further assistance with her or other patients in the future.   Antwane Grose A. Felecia Shelling, MD, [facility] 05/12/3015, 0:10 PM Certified in Neurology, Clinical Neurophysiology, Sleep Medicine and Neuroimaging  [facility] [address], [place], [place] [address] [phone]

## 2023-01-04 ENCOUNTER — Telehealth: Payer: Self-pay | Admitting: Neurology

## 2023-01-04 NOTE — Telephone Encounter (Signed)
Craig Staggers: [id number] exp. 01/04/23-02/03/23 sent to [facility] [phone]

## 2023-01-07 ENCOUNTER — Encounter (INDEPENDENT_AMBULATORY_CARE_PROVIDER_SITE_OTHER): Payer: Medicare PPO | Admitting: Ophthalmology

## 2023-01-07 DIAGNOSIS — H353122 Nonexudative age-related macular degeneration, left eye, intermediate dry stage: Secondary | ICD-10-CM | POA: Diagnosis not present

## 2023-01-07 DIAGNOSIS — H35033 Hypertensive retinopathy, bilateral: Secondary | ICD-10-CM | POA: Diagnosis not present

## 2023-01-07 DIAGNOSIS — H43813 Vitreous degeneration, bilateral: Secondary | ICD-10-CM

## 2023-01-07 DIAGNOSIS — I10 Essential (primary) hypertension: Secondary | ICD-10-CM

## 2023-01-07 DIAGNOSIS — H348312 Tributary (branch) retinal vein occlusion, right eye, stable: Secondary | ICD-10-CM | POA: Diagnosis not present

## 2023-01-14 ENCOUNTER — Ambulatory Visit (HOSPITAL_COMMUNITY)
Admission: RE | Admit: 2023-01-14 | Discharge: 2023-01-14 | Disposition: A | Discharge status: Home / Self Care | Payer: Medicare PPO | Source: Ambulatory Visit | Attending: Neurology | Admitting: Neurology

## 2023-01-14 DIAGNOSIS — R269 Unspecified abnormalities of gait and mobility: Secondary | ICD-10-CM | POA: Diagnosis not present

## 2023-01-14 DIAGNOSIS — G959 Disease of spinal cord, unspecified: Secondary | ICD-10-CM

## 2023-01-14 DIAGNOSIS — R200 Anesthesia of skin: Secondary | ICD-10-CM | POA: Insufficient documentation

## 2023-01-14 DIAGNOSIS — R29898 Other symptoms and signs involving the musculoskeletal system: Secondary | ICD-10-CM | POA: Diagnosis not present

## 2023-01-14 DIAGNOSIS — M542 Cervicalgia: Secondary | ICD-10-CM | POA: Diagnosis not present

## 2023-01-14 DIAGNOSIS — M4312 Spondylolisthesis, cervical region: Secondary | ICD-10-CM | POA: Diagnosis not present

## 2023-01-17 ENCOUNTER — Telehealth: Payer: Self-pay | Admitting: Neurology

## 2023-01-17 DIAGNOSIS — M4802 Spinal stenosis, cervical region: Secondary | ICD-10-CM

## 2023-01-17 DIAGNOSIS — G9520 Unspecified cord compression: Secondary | ICD-10-CM

## 2023-01-17 DIAGNOSIS — R200 Anesthesia of skin: Secondary | ICD-10-CM

## 2023-01-17 NOTE — Telephone Encounter (Signed)
Referral sent to [facility], phone # [phone]. 

## 2023-01-17 NOTE — Telephone Encounter (Signed)
I personally reviewed the MRI of the cervical spine.  It shows multilevel degenerative changes.  At C3-C4, there is severe spinal stenosis with edema within the spinal cord.  There is also severe spinal stenosis at C4-C5 though the adjacent spinal cord has normal signal.  Spinal stenosis is less severe at other levels.  There is also multilevel foraminal narrowing.  I spoke with her daughter, Marlena Clipper, to let her know the results and she will pass them along to her mother.  Due to the compression at C3-C4, I recommend that she see neurosurgery.  The daughter has requested either Dr. Ronnald Ramp or Dr. Saintclair Halsted.  Anderson Malta stated that her mother is doing about the same this week as she was when I saw her 2 weeks ago.

## 2023-01-18 DIAGNOSIS — M4802 Spinal stenosis, cervical region: Secondary | ICD-10-CM | POA: Diagnosis not present

## 2023-01-18 DIAGNOSIS — M4712 Other spondylosis with myelopathy, cervical region: Secondary | ICD-10-CM | POA: Diagnosis not present

## 2023-01-19 ENCOUNTER — Other Ambulatory Visit: Payer: Self-pay | Admitting: Neurosurgery

## 2023-01-21 NOTE — Pre-Procedure Instructions (Signed)
Surgical Instructions    Your procedure is scheduled on Wednesday, February 21st.  Report to [facility] Main Entrance "A" at 06:30 A.M., then check in with the Admitting office.  Call this number if you have problems the morning of surgery:  [phone]  If you have any questions prior to your surgery date call [phone]: Open Monday-Friday 8am-4pm If you experience any cold or flu symptoms such as cough, fever, chills, shortness of breath, etc. between now and your scheduled surgery, please notify us at the above number.     Remember:  Do not eat after midnight the night before your surgery  You may drink clear liquids until 05:30 AM the morning of your surgery.   Clear liquids allowed are: Water, Non-Citrus Juices (without pulp), Carbonated Beverages, Clear Tea, Black Coffee Only (NO MILK, CREAM OR POWDERED CREAMER of any kind), and Gatorade.    Take these medicines the morning of surgery with A SIP OF WATER  amLODipine (NORVASC)  pravastatin (PRAVACHOL)  SYNTHROID     As of today, STOP taking any Aspirin (unless otherwise instructed by your surgeon) Aleve, Naproxen, Ibuprofen, Motrin, Advil, Goody's, BC's, all herbal medications, fish oil, and all vitamins.                     Do NOT Smoke (Tobacco/Vaping) for 24 hours prior to your procedure.  If you use a CPAP at night, you may bring your mask/headgear for your overnight stay.   Contacts, glasses, piercing's, hearing aid's, dentures or partials may not be worn into surgery, please bring cases for these belongings.    For patients admitted to the hospital, discharge time will be determined by your treatment team.   Patients discharged the day of surgery will not be allowed to drive home, and someone needs to stay with them for 24 hours.  SURGICAL WAITING ROOM VISITATION Patients having surgery or a procedure may have no more than 2 support people in the waiting area - these visitors may rotate.   Children under the age  of 45 must have an adult with them who is not the patient. If the patient needs to stay at the hospital during part of their recovery, the visitor guidelines for inpatient rooms apply. Pre-op nurse will coordinate an appropriate time for 1 support person to accompany patient in pre-op.  This support person may not rotate.   Please refer to the [facility] website for the visitor guidelines for Inpatients (after your surgery is over and you are in a regular room).    Special instructions:   [facility]- Preparing For Surgery  Before surgery, you can play an important role. Because skin is not sterile, your skin needs to be as free of germs as possible. You can reduce the number of germs on your skin by washing with CHG (chlorahexidine gluconate) Soap before surgery.  CHG is an antiseptic cleaner which kills germs and bonds with the skin to continue killing germs even after washing.    Oral Hygiene is also important to reduce your risk of infection.  Remember - BRUSH YOUR TEETH THE MORNING OF SURGERY WITH YOUR REGULAR TOOTHPASTE  Please do not use if you have an allergy to CHG or antibacterial soaps. If your skin becomes reddened/irritated stop using the CHG.  Do not shave (including legs and underarms) for at least 48 hours prior to first CHG shower. It is OK to shave your face.  Please follow these instructions carefully.   Shower the  NIGHT BEFORE SURGERY and the MORNING OF SURGERY  If you chose to wash your hair, wash your hair first as usual with your normal shampoo.  After you shampoo, rinse your hair and body thoroughly to remove the shampoo.  Use CHG Soap as you would any other liquid soap. You can apply CHG directly to the skin and wash gently with a scrungie or a clean washcloth.   Apply the CHG Soap to your body ONLY FROM THE NECK DOWN.  Do not use on open wounds or open sores. Avoid contact with your eyes, ears, mouth and genitals (private parts). Wash Face and genitals (private  parts)  with your normal soap.   Wash thoroughly, paying special attention to the area where your surgery will be performed.  Thoroughly rinse your body with warm water from the neck down.  DO NOT shower/wash with your normal soap after using and rinsing off the CHG Soap.  Pat yourself dry with a CLEAN TOWEL.  Wear CLEAN PAJAMAS to bed the night before surgery  Place CLEAN SHEETS on your bed the night before your surgery  DO NOT SLEEP WITH PETS.   Day of Surgery: Take a shower with CHG soap. Do not wear jewelry or makeup Do not wear lotions, powders, perfumes, or deodorant. Do not shave 48 hours prior to surgery.   Do not bring valuables to the hospital. [facility] is not responsible for any belongings or valuables. Do not wear nail polish, gel polish, artificial nails, or any other type of covering on natural nails (fingers and toes) If you have artificial nails or gel coating that need to be removed by a nail salon, please have this removed prior to surgery. Artificial nails or gel coating may interfere with anesthesia's ability to adequately monitor your vital signs. Wear Clean/Comfortable clothing the morning of surgery Remember to brush your teeth WITH YOUR REGULAR TOOTHPASTE.   Please read over the following fact sheets that you were given.    If you received a COVID test during your pre-op visit  it is requested that you wear a mask when out in public, stay away from anyone that may not be feeling well and notify your surgeon if you develop symptoms. If you have been in contact with anyone that has tested positive in the last 10 days please notify you surgeon.

## 2023-01-24 ENCOUNTER — Encounter (HOSPITAL_COMMUNITY): Payer: Self-pay

## 2023-01-24 ENCOUNTER — Encounter (HOSPITAL_COMMUNITY)
Admission: RE | Admit: 2023-01-24 | Discharge: 2023-01-24 | Disposition: A | Discharge status: Home / Self Care | Payer: Medicare PPO | Source: Ambulatory Visit | Attending: Neurosurgery | Admitting: Neurosurgery

## 2023-01-24 ENCOUNTER — Other Ambulatory Visit: Payer: Self-pay

## 2023-01-24 VITALS — BP 162/84 | HR 73 | Temp 97.6°F | Resp 16 | Ht 62.0 in | Wt 187.0 lb

## 2023-01-24 DIAGNOSIS — Z01818 Encounter for other preprocedural examination: Secondary | ICD-10-CM | POA: Insufficient documentation

## 2023-01-24 DIAGNOSIS — M4312 Spondylolisthesis, cervical region: Secondary | ICD-10-CM | POA: Diagnosis not present

## 2023-01-24 DIAGNOSIS — I10 Essential (primary) hypertension: Secondary | ICD-10-CM | POA: Insufficient documentation

## 2023-01-24 HISTORY — DX: Hypothyroidism, unspecified: E03.9

## 2023-01-24 LAB — BASIC METABOLIC PANEL
Anion gap: 10 (ref 5–15)
BUN: 11 mg/dL (ref 8–23)
CO2: 23 mmol/L (ref 22–32)
Calcium: 9.4 mg/dL (ref 8.9–10.3)
Chloride: 107 mmol/L (ref 98–111)
Creatinine, Ser: 0.81 mg/dL (ref 0.44–1.00)
GFR, Estimated: >60 mL/min (ref >60)
Glucose, Bld: 106 mg/dL — ABNORMAL HIGH (ref 70–99)
Potassium: 3.7 mmol/L (ref 3.5–5.1)
Sodium: 140 mmol/L (ref 135–145)

## 2023-01-24 LAB — CBC
HCT: 41.8 % (ref 36.0–46.0)
Hemoglobin: 14 g/dL (ref 12.0–15.0)
MCH: 31.4 pg (ref 26.0–34.0)
MCHC: 33.5 g/dL (ref 30.0–36.0)
MCV: 93.7 fL (ref 80.0–100.0)
Platelets: 353 10*3/uL (ref 150–400)
RBC: 4.46 MIL/uL (ref 3.87–5.11)
RDW: 12.8 % (ref 11.5–15.5)
WBC: 6 10*3/uL (ref 4.0–10.5)
nRBC: 0 % (ref 0.0–0.2)

## 2023-01-24 LAB — SURGICAL PCR SCREEN
MRSA, PCR: NEGATIVE
Staphylococcus aureus: NEGATIVE

## 2023-01-24 NOTE — Progress Notes (Addendum)
PCP - Dr. Claretta Fraise Cardiologist - denies  PPM/ICD - n/a  Chest x-ray - n/a EKG - 01/24/23 Stress Test - denies ECHO - denies Cardiac Cath - denies  Sleep Study - denies CPAP - denies  Last dose of GLP1 agonist-  n/a GLP1 instructions: n/a  Blood Thinner Instructions: n/a Aspirin Instructions: n/a  ERAS Protcol -Clear liquids until 0530 DOS PRE-SURGERY Ensure or G2- none ordered.  COVID TEST- n/a  Anesthesia review: Yes, EKG review.   Patient denies shortness of breath, fever, cough and chest pain at PAT appointment   All instructions explained to the patient, with a verbal understanding of the material. Patient agrees to go over the instructions while at home for a better understanding. Patient also instructed to self quarantine after being tested for COVID-19. The opportunity to ask questions was provided.

## 2023-01-25 NOTE — Anesthesia Preprocedure Evaluation (Signed)
Anesthesia Evaluation  Patient identified by MRN, date of birth, ID band Patient awake    Reviewed: Allergy & Precautions, H&P , NPO status , Patient's Chart, lab work & pertinent test results  Airway Mallampati: II  TM Distance: >3 FB Neck ROM: Full    Dental  (+) Missing, Dental Advisory Given   Pulmonary neg pulmonary ROS   Pulmonary exam normal breath sounds clear to auscultation       Cardiovascular hypertension, Pt. on medications Normal cardiovascular exam Rhythm:Regular Rate:Normal     Neuro/Psych negative neurological ROS     GI/Hepatic negative GI ROS, Neg liver ROS,,,  Endo/Other  Hypothyroidism    Renal/GU negative Renal ROS     Musculoskeletal  (+) Arthritis ,    Abdominal   Peds  Hematology negative hematology ROS (+)   Anesthesia Other Findings   Reproductive/Obstetrics                             Anesthesia Physical Anesthesia Plan  ASA: 3  Anesthesia Plan: General   Post-op Pain Management:  Regional for Post-op pain and Tylenol PO (pre-op)*   Induction: Intravenous  PONV Risk Score and Plan: 4 or greater and Ondansetron, Dexamethasone and Treatment may vary due to age or medical condition  Airway Management Planned: Oral ETT and Video Laryngoscope Planned  Additional Equipment: ClearSight  Intra-op Plan:   Post-operative Plan: Extubation in OR  Informed Consent: I have reviewed the patients History and Physical, chart, labs and discussed the procedure including the risks, benefits and alternatives for the proposed anesthesia with the patient or authorized representative who has indicated his/her understanding and acceptance.     Dental advisory given  Plan Discussed with: CRNA  Anesthesia Plan Comments:         Anesthesia Quick Evaluation

## 2023-01-26 ENCOUNTER — Ambulatory Visit (HOSPITAL_COMMUNITY): Payer: Medicare PPO

## 2023-01-26 ENCOUNTER — Ambulatory Visit (HOSPITAL_BASED_OUTPATIENT_CLINIC_OR_DEPARTMENT_OTHER): Payer: Medicare PPO | Admitting: Certified Registered"

## 2023-01-26 ENCOUNTER — Other Ambulatory Visit: Payer: Self-pay

## 2023-01-26 ENCOUNTER — Ambulatory Visit (HOSPITAL_COMMUNITY)
Admission: RE | Admit: 2023-01-26 | Discharge: 2023-01-27 | Disposition: A | Discharge status: Home / Self Care | Payer: Medicare PPO | Attending: Neurosurgery | Admitting: Neurosurgery

## 2023-01-26 ENCOUNTER — Ambulatory Visit (HOSPITAL_COMMUNITY): Payer: Medicare PPO | Admitting: Physician Assistant

## 2023-01-26 ENCOUNTER — Encounter (HOSPITAL_COMMUNITY): Payer: Self-pay | Admitting: Neurosurgery

## 2023-01-26 ENCOUNTER — Ambulatory Visit (HOSPITAL_COMMUNITY)
Admission: RE | Disposition: A | Discharge status: Home / Self Care | Payer: Self-pay | Source: Home / Self Care | Attending: Neurosurgery

## 2023-01-26 DIAGNOSIS — I10 Essential (primary) hypertension: Secondary | ICD-10-CM | POA: Diagnosis not present

## 2023-01-26 DIAGNOSIS — M4802 Spinal stenosis, cervical region: Secondary | ICD-10-CM | POA: Diagnosis present

## 2023-01-26 DIAGNOSIS — M4712 Other spondylosis with myelopathy, cervical region: Secondary | ICD-10-CM | POA: Insufficient documentation

## 2023-01-26 DIAGNOSIS — E039 Hypothyroidism, unspecified: Secondary | ICD-10-CM | POA: Insufficient documentation

## 2023-01-26 DIAGNOSIS — M1990 Unspecified osteoarthritis, unspecified site: Secondary | ICD-10-CM | POA: Diagnosis not present

## 2023-01-26 DIAGNOSIS — M4322 Fusion of spine, cervical region: Secondary | ICD-10-CM | POA: Diagnosis not present

## 2023-01-26 DIAGNOSIS — G992 Myelopathy in diseases classified elsewhere: Secondary | ICD-10-CM | POA: Diagnosis present

## 2023-01-26 DIAGNOSIS — E0590 Thyrotoxicosis, unspecified without thyrotoxic crisis or storm: Secondary | ICD-10-CM | POA: Diagnosis not present

## 2023-01-26 HISTORY — PX: ANTERIOR CERVICAL DECOMP/DISCECTOMY FUSION: SHX1161

## 2023-01-26 SURGERY — ANTERIOR CERVICAL DECOMPRESSION/DISCECTOMY FUSION 2 LEVELS
Anesthesia: General

## 2023-01-26 MED ORDER — FENTANYL CITRATE (PF) 250 MCG/5ML IJ SOLN
INTRAMUSCULAR | Status: AC
  Filled 2023-01-26: qty 5

## 2023-01-26 MED ORDER — LIDOCAINE 2% (20 MG/ML) 5 ML SYRINGE
INTRAMUSCULAR | Status: DC | PRN
  Administered 2023-01-26: 80 mg via INTRAVENOUS

## 2023-01-26 MED ORDER — PRAVASTATIN SODIUM 40 MG PO TABS: 80.0000 mg | ORAL_TABLET | Freq: Every day | ORAL | Status: DC

## 2023-01-26 MED ORDER — PHENYLEPHRINE HCL-NACL 20-0.9 MG/250ML-% IV SOLN
INTRAVENOUS | Status: DC | PRN
  Administered 2023-01-26: 35 ug/min via INTRAVENOUS

## 2023-01-26 MED ORDER — PHENOL 1.4 % MT LIQD: 1.0000 | OROMUCOSAL | Status: DC | PRN

## 2023-01-26 MED ORDER — OXYCODONE HCL 5 MG PO TABS: 5.0000 mg | ORAL_TABLET | Freq: Once | ORAL | Status: AC

## 2023-01-26 MED ORDER — THROMBIN 5000 UNITS EX SOLR
OROMUCOSAL | Status: DC | PRN
  Administered 2023-01-26: 5 mL via TOPICAL

## 2023-01-26 MED ORDER — SODIUM CHLORIDE 0.9 % IV SOLN
250.0000 mL | INTRAVENOUS | Status: DC
  Administered 2023-01-26: 250 mL via INTRAVENOUS

## 2023-01-26 MED ORDER — DEXAMETHASONE SODIUM PHOSPHATE 10 MG/ML IJ SOLN
INTRAMUSCULAR | Status: DC | PRN
  Administered 2023-01-26: 10 mg via INTRAVENOUS

## 2023-01-26 MED ORDER — CEFAZOLIN SODIUM-DEXTROSE 2-4 GM/100ML-% IV SOLN
INTRAVENOUS | Status: AC
  Filled 2023-01-26: qty 100

## 2023-01-26 MED ORDER — CEFAZOLIN SODIUM-DEXTROSE 2-3 GM-%(50ML) IV SOLR
INTRAVENOUS | Status: DC | PRN
  Administered 2023-01-26: 2 g via INTRAVENOUS

## 2023-01-26 MED ORDER — OXYCODONE HCL 5 MG PO TABS
ORAL_TABLET | ORAL | Status: AC
  Administered 2023-01-26: 5 mg via ORAL
  Filled 2023-01-26: qty 1

## 2023-01-26 MED ORDER — ACETAMINOPHEN 500 MG PO TABS
1000.0000 mg | ORAL_TABLET | Freq: Once | ORAL | Status: AC
  Administered 2023-01-26: 1000 mg via ORAL
  Filled 2023-01-26: qty 2

## 2023-01-26 MED ORDER — PROPOFOL 10 MG/ML IV BOLUS
INTRAVENOUS | Status: DC | PRN
  Administered 2023-01-26: 120 mg via INTRAVENOUS

## 2023-01-26 MED ORDER — ACETAMINOPHEN 325 MG PO TABS
650.0000 mg | ORAL_TABLET | ORAL | Status: DC | PRN
  Administered 2023-01-26 – 2023-01-27 (×2): 650 mg via ORAL
  Filled 2023-01-26 (×3): qty 2

## 2023-01-26 MED ORDER — ONDANSETRON HCL 4 MG/2ML IJ SOLN
INTRAMUSCULAR | Status: DC | PRN
  Administered 2023-01-26: 4 mg via INTRAVENOUS

## 2023-01-26 MED ORDER — HYDROCODONE-ACETAMINOPHEN 5-325 MG PO TABS: 2.0000 | ORAL_TABLET | ORAL | Status: DC | PRN

## 2023-01-26 MED ORDER — AMLODIPINE BESYLATE 5 MG PO TABS: 5.0000 mg | ORAL_TABLET | Freq: Every day | ORAL | Status: DC

## 2023-01-26 MED ORDER — AMISULPRIDE (ANTIEMETIC) 5 MG/2ML IV SOLN
INTRAVENOUS | Status: AC
  Filled 2023-01-26: qty 4

## 2023-01-26 MED ORDER — CHLORHEXIDINE GLUCONATE 0.12 % MT SOLN
15.0000 mL | Freq: Once | OROMUCOSAL | Status: AC
  Administered 2023-01-26: 15 mL via OROMUCOSAL
  Filled 2023-01-26: qty 15

## 2023-01-26 MED ORDER — ORAL CARE MOUTH RINSE: 15.0000 mL | Freq: Once | OROMUCOSAL | Status: AC

## 2023-01-26 MED ORDER — LACTATED RINGERS IV SOLN: INTRAVENOUS | Status: DC | PRN

## 2023-01-26 MED ORDER — PROPOFOL 10 MG/ML IV BOLUS
INTRAVENOUS | Status: AC
  Filled 2023-01-26: qty 20

## 2023-01-26 MED ORDER — CYCLOBENZAPRINE HCL 10 MG PO TABS: 10.0000 mg | ORAL_TABLET | Freq: Three times a day (TID) | ORAL | Status: DC | PRN

## 2023-01-26 MED ORDER — PHENYLEPHRINE HCL (PRESSORS) 10 MG/ML IV SOLN
INTRAVENOUS | Status: DC | PRN
  Administered 2023-01-26: 80 ug via INTRAVENOUS

## 2023-01-26 MED ORDER — FENTANYL CITRATE (PF) 250 MCG/5ML IJ SOLN
INTRAMUSCULAR | Status: DC | PRN
  Administered 2023-01-26: 25 ug via INTRAVENOUS
  Administered 2023-01-26: 100 ug via INTRAVENOUS
  Administered 2023-01-26: 50 ug via INTRAVENOUS

## 2023-01-26 MED ORDER — HYDROMORPHONE HCL 1 MG/ML IJ SOLN
0.2500 mg | INTRAMUSCULAR | Status: DC | PRN
  Administered 2023-01-26: 0.5 mg via INTRAVENOUS
  Administered 2023-01-26: 0.25 mg via INTRAVENOUS

## 2023-01-26 MED ORDER — ROCURONIUM BROMIDE 10 MG/ML (PF) SYRINGE
PREFILLED_SYRINGE | INTRAVENOUS | Status: DC | PRN
  Administered 2023-01-26: 40 mg via INTRAVENOUS
  Administered 2023-01-26: 60 mg via INTRAVENOUS

## 2023-01-26 MED ORDER — MUPIROCIN CALCIUM 2 % EX CREA
1.0000 | TOPICAL_CREAM | Freq: Two times a day (BID) | CUTANEOUS | Status: DC
  Filled 2023-01-26: qty 15

## 2023-01-26 MED ORDER — HYDROMORPHONE HCL 1 MG/ML IJ SOLN: 0.5000 mg | INTRAMUSCULAR | Status: DC | PRN

## 2023-01-26 MED ORDER — 0.9 % SODIUM CHLORIDE (POUR BTL) OPTIME
TOPICAL | Status: DC | PRN
  Administered 2023-01-26: 1000 mL

## 2023-01-26 MED ORDER — SODIUM CHLORIDE 0.9% FLUSH: 3.0000 mL | Freq: Two times a day (BID) | INTRAVENOUS | Status: DC

## 2023-01-26 MED ORDER — THROMBIN 5000 UNITS EX SOLR
CUTANEOUS | Status: AC
  Filled 2023-01-26: qty 15000

## 2023-01-26 MED ORDER — LACTATED RINGERS IV SOLN: INTRAVENOUS | Status: DC

## 2023-01-26 MED ORDER — CEFAZOLIN SODIUM-DEXTROSE 2-4 GM/100ML-% IV SOLN
2.0000 g | Freq: Three times a day (TID) | INTRAVENOUS | Status: AC
  Administered 2023-01-26 – 2023-01-27 (×2): 2 g via INTRAVENOUS
  Filled 2023-01-26 (×2): qty 100

## 2023-01-26 MED ORDER — ACETAMINOPHEN 650 MG RE SUPP: 650.0000 mg | RECTAL | Status: DC | PRN

## 2023-01-26 MED ORDER — ONDANSETRON HCL 4 MG PO TABS: 4.0000 mg | ORAL_TABLET | Freq: Four times a day (QID) | ORAL | Status: DC | PRN

## 2023-01-26 MED ORDER — MENTHOL 3 MG MT LOZG: 1.0000 | LOZENGE | OROMUCOSAL | Status: DC | PRN

## 2023-01-26 MED ORDER — HYDROMORPHONE HCL 1 MG/ML IJ SOLN
INTRAMUSCULAR | Status: AC
  Administered 2023-01-26: 0.5 mg via INTRAVENOUS
  Filled 2023-01-26: qty 1

## 2023-01-26 MED ORDER — LEVOTHYROXINE SODIUM 25 MCG PO TABS
125.0000 ug | ORAL_TABLET | Freq: Every day | ORAL | Status: DC
  Administered 2023-01-27: 125 ug via ORAL
  Filled 2023-01-26: qty 1

## 2023-01-26 MED ORDER — MECLIZINE HCL 25 MG PO TABS: 50.0000 mg | ORAL_TABLET | Freq: Three times a day (TID) | ORAL | Status: DC | PRN

## 2023-01-26 MED ORDER — ONDANSETRON HCL 4 MG/2ML IJ SOLN: 4.0000 mg | Freq: Four times a day (QID) | INTRAMUSCULAR | Status: DC | PRN

## 2023-01-26 MED ORDER — HYDROMORPHONE HCL 1 MG/ML IJ SOLN
INTRAMUSCULAR | Status: AC
  Administered 2023-01-26: 0.25 mg via INTRAVENOUS
  Filled 2023-01-26: qty 1

## 2023-01-26 MED ORDER — PANTOPRAZOLE SODIUM 40 MG PO TBEC
40.0000 mg | DELAYED_RELEASE_TABLET | Freq: Every day | ORAL | Status: DC
  Administered 2023-01-26: 40 mg via ORAL
  Filled 2023-01-26: qty 1

## 2023-01-26 MED ORDER — PANTOPRAZOLE SODIUM 40 MG IV SOLR: 40.0000 mg | Freq: Every day | INTRAVENOUS | Status: DC

## 2023-01-26 MED ORDER — AMISULPRIDE (ANTIEMETIC) 5 MG/2ML IV SOLN: 10.0000 mg | Freq: Once | INTRAVENOUS | Status: DC | PRN

## 2023-01-26 MED ORDER — EPHEDRINE SULFATE-NACL 50-0.9 MG/10ML-% IV SOSY
PREFILLED_SYRINGE | INTRAVENOUS | Status: DC | PRN
  Administered 2023-01-26: 10 mg via INTRAVENOUS

## 2023-01-26 MED ORDER — CEFAZOLIN SODIUM-DEXTROSE 2-4 GM/100ML-% IV SOLN: 2.0000 g | Freq: Three times a day (TID) | INTRAVENOUS | Status: DC

## 2023-01-26 MED ORDER — CYCLOBENZAPRINE HCL 10 MG PO TABS
ORAL_TABLET | ORAL | Status: AC
  Administered 2023-01-26: 10 mg via ORAL
  Filled 2023-01-26: qty 1

## 2023-01-26 MED ORDER — ADULT MULTIVITAMIN W/MINERALS CH: 1.0000 | ORAL_TABLET | Freq: Every day | ORAL | Status: DC

## 2023-01-26 MED ORDER — LISINOPRIL 20 MG PO TABS: 20.0000 mg | ORAL_TABLET | Freq: Every day | ORAL | Status: DC

## 2023-01-26 MED ORDER — THROMBIN (RECOMBINANT) 5000 UNITS EX SOLR
CUTANEOUS | Status: DC | PRN
  Administered 2023-01-26: 10 mL via TOPICAL

## 2023-01-26 MED ORDER — SODIUM CHLORIDE 0.9% FLUSH: 3.0000 mL | INTRAVENOUS | Status: DC | PRN

## 2023-01-26 MED ORDER — SUGAMMADEX SODIUM 200 MG/2ML IV SOLN
INTRAVENOUS | Status: DC | PRN
  Administered 2023-01-26: 200 mg via INTRAVENOUS

## 2023-01-26 MED ORDER — ALUM & MAG HYDROXIDE-SIMETH 200-200-20 MG/5ML PO SUSP: 30.0000 mL | Freq: Four times a day (QID) | ORAL | Status: DC | PRN

## 2023-01-26 SURGICAL SUPPLY — 59 items
ADH SKN CLS APL DERMABOND .7 (GAUZE/BANDAGES/DRESSINGS) ×1
APL SKNCLS STERI-STRIP NONHPOA (GAUZE/BANDAGES/DRESSINGS) ×1
BAG COUNTER SPONGE SURGICOUNT (BAG) ×2 IMPLANT
BAG SPNG CNTER NS LX DISP (BAG) ×1
BAND INSRT 18 STRL LF DISP RB (MISCELLANEOUS) ×2
BAND RUBBER #18 3X1/16 STRL (MISCELLANEOUS) ×4 IMPLANT
BASKET BONE COLLECTION (BASKET) ×2 IMPLANT
BENZOIN TINCTURE PRP APPL 2/3 (GAUZE/BANDAGES/DRESSINGS) ×2 IMPLANT
BIT DRILL NEURO 2X3.1 SFT TUCH (MISCELLANEOUS) ×2 IMPLANT
BONE CC-ACS 11X14X6 6D (Bone Implant) ×2 IMPLANT
BONE CC-ACS 11X14X7 6D (Bone Implant) ×1 IMPLANT
BUR MATCHSTICK NEURO 3.0 LAGG (BURR) ×2 IMPLANT
CANISTER SUCT 3000ML PPV (MISCELLANEOUS) ×2 IMPLANT
CHIPS BONE CANC-ACS11X14X6 6D (Bone Implant) IMPLANT
CHIPS BONE CANC-ACS11X14X7 6D (Bone Implant) IMPLANT
DERMABOND ADVANCED .7 DNX12 (GAUZE/BANDAGES/DRESSINGS) IMPLANT
DRAPE C-ARM 42X72 X-RAY (DRAPES) ×4 IMPLANT
DRAPE LAPAROTOMY 100X72 PEDS (DRAPES) ×2 IMPLANT
DRAPE MICROSCOPE SLANT 54X150 (MISCELLANEOUS) ×2 IMPLANT
DRILL NEURO 2X3.1 SOFT TOUCH (MISCELLANEOUS) ×1
DRSG OPSITE POSTOP 4X6 (GAUZE/BANDAGES/DRESSINGS) IMPLANT
DURAPREP 6ML APPLICATOR 50/CS (WOUND CARE) ×2 IMPLANT
ELECT COATED BLADE 2.86 ST (ELECTRODE) ×2 IMPLANT
ELECT REM PT RETURN 9FT ADLT (ELECTROSURGICAL) ×1
ELECTRODE REM PT RTRN 9FT ADLT (ELECTROSURGICAL) ×2 IMPLANT
GAUZE 4X4 16PLY ~~LOC~~+RFID DBL (SPONGE) IMPLANT
GAUZE SPONGE 4X4 12PLY STRL (GAUZE/BANDAGES/DRESSINGS) ×2 IMPLANT
GLOVE BIO SURGEON STRL SZ7 (GLOVE) IMPLANT
GLOVE BIO SURGEON STRL SZ8 (GLOVE) ×2 IMPLANT
GLOVE BIOGEL PI IND STRL 7.0 (GLOVE) IMPLANT
GLOVE EXAM NITRILE XL STR (GLOVE) IMPLANT
GLOVE INDICATOR 8.5 STRL (GLOVE) ×4 IMPLANT
GOWN STRL REUS W/ TWL LRG LVL3 (GOWN DISPOSABLE) IMPLANT
GOWN STRL REUS W/ TWL XL LVL3 (GOWN DISPOSABLE) ×2 IMPLANT
GOWN STRL REUS W/TWL 2XL LVL3 (GOWN DISPOSABLE) IMPLANT
GOWN STRL REUS W/TWL LRG LVL3 (GOWN DISPOSABLE)
GOWN STRL REUS W/TWL XL LVL3 (GOWN DISPOSABLE) ×1
HALTER HD/CHIN CERV TRACTION D (MISCELLANEOUS) ×2 IMPLANT
HEMOSTAT POWDER KIT SURGIFOAM (HEMOSTASIS) ×2 IMPLANT
KIT BASIN OR (CUSTOM PROCEDURE TRAY) ×2 IMPLANT
KIT TURNOVER KIT B (KITS) ×2 IMPLANT
NDL SPNL 20GX3.5 QUINCKE YW (NEEDLE) ×2 IMPLANT
NEEDLE SPNL 20GX3.5 QUINCKE YW (NEEDLE) ×1 IMPLANT
NS IRRIG 1000ML POUR BTL (IV SOLUTION) ×2 IMPLANT
PACK LAMINECTOMY NEURO (CUSTOM PROCEDURE TRAY) ×2 IMPLANT
PAD ARMBOARD 7.5X6 YLW CONV (MISCELLANEOUS) ×6 IMPLANT
PIN DISTRACTION 14MM (PIN) IMPLANT
PLATE CERV RES 30 2L (Plate) IMPLANT
SCREW VA SD RESONATE 4.2X14 (Screw) IMPLANT
SPONGE INTESTINAL PEANUT (DISPOSABLE) ×2 IMPLANT
SPONGE SURGIFOAM ABS GEL SZ50 (HEMOSTASIS) ×2 IMPLANT
SPONGE T-LAP 4X18 ~~LOC~~+RFID (SPONGE) IMPLANT
STRIP CLOSURE SKIN 1/2X4 (GAUZE/BANDAGES/DRESSINGS) ×2 IMPLANT
SUT VIC AB 3-0 SH 8-18 (SUTURE) ×2 IMPLANT
SUT VICRYL 4-0 PS2 18IN ABS (SUTURE) ×2 IMPLANT
TAPE CLOTH 4X10 WHT NS (GAUZE/BANDAGES/DRESSINGS) ×2 IMPLANT
TOWEL GREEN STERILE (TOWEL DISPOSABLE) ×2 IMPLANT
TOWEL GREEN STERILE FF (TOWEL DISPOSABLE) ×2 IMPLANT
WATER STERILE IRR 1000ML POUR (IV SOLUTION) ×2 IMPLANT

## 2023-01-26 NOTE — Transfer of Care (Signed)
Immediate Anesthesia Transfer of Care Note  Patient: Chelsea Finley  Procedure(s) Performed: ANTERIOR CERVICAL DECOMPRESSION/DISECTOMY FUSION CERVICAL THREE-CERVICAL FOUR - CERVICAL FOUR-CERIVACL FIVE  Patient Location: PACU  Anesthesia Type:General  Level of Consciousness: awake, alert , and oriented  Airway & Oxygen Therapy: Patient connected to face mask oxygen  Post-op Assessment: Post -op Vital signs reviewed and stable  Post vital signs: stable  Last Vitals:  Vitals Value Taken Time  BP 114/51 01/26/23 1047  Temp    Pulse 82 01/26/23 1048  Resp 13 01/26/23 1048  SpO2 95 % 01/26/23 1048  Vitals shown include unvalidated device data.  Last Pain:  Vitals:   01/26/23 0658  TempSrc:   PainSc: 0-No pain      Patients Stated Pain Goal: 0 ([id number])  Complications: No notable events documented.

## 2023-01-26 NOTE — Op Note (Signed)
Preoperative diagnosis: Cervical spondylitic myelopathy from severe cervical stenosis cord compression C3-4 C4-5.  Postoperative diagnosis: Same.  Procedure: Anterior cervical discectomy and fusion C3-4 C4-5 utilizing allograft spacers and the globus resonate plating system.  Surgeon: Kary Kos.  Assistant: Nash Shearer.  Anesthesia: General.  EBL: Minimal.  HPI: 82 year old female with progressive worsening numbness tingling weakness and in her left arm primarily difficulty walking and balance workup revealed severe cord compression at C3-4 and C4-5 and due to the patient's progressive clinical syndrome imaging findings of a conservative treatment I recommended anterior cervical discectomy and fusion at those 2 levels.  I extensively reviewed the risks and benefits of the operation with her as well as perioperative course expectations of outcome and alternatives of surgery and she understood and agreed to proceed forward.  Operative procedure: Patient was brought into the OR was induced under general anesthesia positioned supine the neck in slight extension 5 pounds halter traction.  The right side of her neck was prepped and draped in routine sterile fashion preoperative x-ray localized the appropriate level so a curvilinear incision was made just off the midline to the anterior border of the sternocleidomastoid and the superficial lobe test was dissected out divided longitudinally.  The avascular plane between the sternomastoid and strap muscle was developed down to the prevertebral fascia and the prevertebral fascia was dissected away with Kitners.  Interoperative x-ray confirmed identification appropriate level.  So anterior osteophytes were bitten off with a Leksell rongeur and a 2 and 3 Miller Kerrison punch disc bases were incised and longus goes reflected laterally.  With a self-retaining retractor in place both disc bases were drilled down to the posterior annulus and osteophytic complex.   Under microscopic lamination first working at C3-4 the disc base was further drilled down until identified the posterior large to ligament and posterior annulus and this was all removed in piecemeal fashion there was a large central and leftward free fragment disc herniation underneath the ligament this was teased away with a nerve hook decompressing the thecal sac aggressive under biting of both endplates further decompress the thecal sac removing extensive spurring.  Marching laterally both C4 nerve roots were identified both C4 nerve roots were decompressed and skeletonized flush with the pedicle.  This was then packed with Gelfoam attention taken at C4-5 in a similar fashion C4-5 was drilled down pathology here was primarily posterior spur coming off the C4 endplate this was all aggressively under Bitton and removed both C5 nerve roots were identified and both C5 nerve roots were decompressed and skeletonized flush with the pedicle.  After adequate discectomy had been achieved and all foramina were patent and there was no further visual central stenosis I sized up to 6 mm implants initially a 7 mm had been opened up inadvertently at C3-4 this was discarded and both 6 mm implants were inserted at C3-4 and C4-5.  Meticulous hemostasis was maintained I then selected a 30 mm globus resonate plate all screws had excellent purchase locking mechanisms were engaged and postop fluoroscopy confirmed good position of the implants.  The wound was then copiously irrigated meticulous hemostasis was maintained the wound was then closed in layers with interrupted Vicryl and the platysma and a running 4 subcuticular.  Dermabond benzoin Steri-Strips and a sterile dressing was applied patient recovery in stable condition.  At the end of the case all needle counts and sponge counts were correct.

## 2023-01-26 NOTE — Progress Notes (Signed)
Orthopedic Tech Progress Note Patient Details:  Chelsea Finley 04/07/1941 [id number]  Ortho Devices Type of Ortho Device: Soft collar Ortho Device/Splint Location: Neck Ortho Device/Splint Interventions: Application   Post Interventions Patient Tolerated: Well  [name] [name] 01/26/2023, 4:18 PM

## 2023-01-26 NOTE — Anesthesia Procedure Notes (Signed)
Procedure Name: Intubation Date/Time: 01/26/2023 8:45 AM  Performed by: Lavell Luster, CRNAPre-anesthesia Checklist: Patient identified, Emergency Drugs available, Suction available, Patient being monitored and Timeout performed Oxygen Delivery Method: Circle system utilized Preoxygenation: Pre-oxygenation with 100% oxygen Induction Type: IV induction Ventilation: Mask ventilation without difficulty Laryngoscope Size: Mac, 3 and Glidescope Grade View: Grade I Tube type: Oral Tube size: 7.5 mm Number of attempts: 1 Airway Equipment and Method: Stylet and Video-laryngoscopy Placement Confirmation: ETT inserted through vocal cords under direct vision, positive ETCO2 and breath sounds checked- equal and bilateral Secured at: 22 cm Tube secured with: Tape Dental Injury: Teeth and Oropharynx as per pre-operative assessment  Comments: Videolaryngoscope intubation planned due to unstable cervical spine.  Cervical spine stabilization maintained.  Dr Saintclair Halsted positioned head and neck after ETT secured.  Henderson Cloud, CRNA

## 2023-01-26 NOTE — H&P (Signed)
Chelsea Finley is an 82 y.o. female.   Chief Complaint: Neck pain left upper extremity weakness difficulty walking balance HPI: 82 year old female with cervical spondylitic myelopathy weakness in her left side and severe cord compression on imaging.  Due to her progression of clinical syndrome imaging findings and failed conservative treatment I recommended anterior cervical discectomy and fusion at C3-4 and C4-5.  I have extensively gone over the risks and benefits of the operation with her as well as perioperative course expectations of outcome and alternatives of surgery and she understands and agrees to proceed forward.  Past Medical History:  Diagnosis Date   Arthritis of knee, left 02/01/2012   Elevated lipoprotein(a) 12/16/2019   Hyperlipidemia    Hypertension    Hypothyroidism     Past Surgical History:  Procedure Laterality Date   APPENDECTOMY     KNEE ARTHROPLASTY  11/16/2011   Procedure: COMPUTER ASSISTED TOTAL KNEE ARTHROPLASTY;  Surgeon: Mcarthur Rossetti;  Location: [facility];  Service: Orthopedics;  Laterality: Right;  Right total knee arthroplasty   KNEE ARTHROPLASTY  02/01/2012   Procedure: COMPUTER ASSISTED TOTAL KNEE ARTHROPLASTY;  Surgeon: Mcarthur Rossetti, MD;  Location: [facility];  Service: Orthopedics;  Laterality: Left;  Left total knee arthroplasty   ROTATOR CUFF REPAIR  05/2010   TUBAL LIGATION      Family History  Problem Relation Age of Onset   Depression Mother    Cancer Father    Social History:  reports that she has never smoked. She has never used smokeless tobacco. She reports that she does not drink alcohol and does not use drugs.  Allergies:  Allergies  Allergen Reactions   Morphine And Related Nausea And Vomiting    Medications Prior to Admission  Medication Sig Dispense Refill   amLODipine (NORVASC) 5 MG tablet TAKE ONE (1) TABLET EACH DAY 90 tablet 3   lisinopril (ZESTRIL) 20 MG tablet TAKE ONE (1) TABLET EACH DAY 90 tablet 3   Multiple  Vitamin (MULTIVITAMIN WITH MINERALS) TABS tablet Take 1 tablet by mouth daily.     pravastatin (PRAVACHOL) 80 MG tablet Take 1 tablet (80 mg total) by mouth daily. 90 tablet 3   SYNTHROID 125 MCG tablet TAKE ONE TABLET EACH MORNING BEFORE BREAKFAST 90 tablet 3   meclizine (ANTIVERT) 50 MG tablet Take 1 tablet (50 mg total) by mouth 3 (three) times daily as needed. (Patient not taking: Reported on 12/30/2022) 30 tablet 0   mupirocin cream (BACTROBAN) 2 % Apply 1 application topically 2 (two) times daily. (Patient not taking: Reported on 12/30/2022) 30 g 0    Results for orders placed or performed during the hospital encounter of 01/24/23 (from the past 48 hour(s))  Surgical pcr screen     Status: None   Collection Time: 01/24/23  8:52 AM   Specimen: Nasal Mucosa; Nasal Swab  Result Value Ref Range   MRSA, PCR NEGATIVE NEGATIVE   Staphylococcus aureus NEGATIVE NEGATIVE    Comment: (NOTE) The Xpert SA Assay (FDA approved for NASAL specimens in patients 49 years of age and older), is one component of a comprehensive surveillance program. It is not intended to diagnose infection nor to guide or monitor treatment. Performed at [facility], [place] [address] [place], [place] [address]   CBC per protocol     Status: None   Collection Time: 01/24/23  9:10 AM  Result Value Ref Range   WBC 6.0 4.0 - 10.5 K/uL   RBC 4.46 3.87 -  5.11 MIL/uL   Hemoglobin 14.0 12.0 - 15.0 g/dL   HCT 41.8 36.0 - 46.0 %   MCV 93.7 80.0 - 100.0 fL   MCH 31.4 26.0 - 34.0 pg   MCHC 33.5 30.0 - 36.0 g/dL   RDW 12.8 11.5 - 15.5 %   Platelets 353 150 - 400 K/uL   nRBC 0.0 0.0 - 0.2 %    Comment: Performed at [facility], [place] [address], [place], [place] [id number]  Basic metabolic panel per protocol     Status: Abnormal   Collection Time: 01/24/23  9:10 AM  Result Value Ref Range   Sodium 140 135 - 145 mmol/L   Potassium 3.7 3.5 - 5.1 mmol/L   Chloride 107 98 - 111 mmol/L   CO2 23 22 - 32 mmol/L    Glucose, Bld 106 (H) 70 - 99 mg/dL    Comment: Glucose reference range applies only to samples taken after fasting for at least 8 hours.   BUN 11 8 - 23 mg/dL   Creatinine, Ser 0.81 0.44 - 1.00 mg/dL   Calcium 9.4 8.9 - 10.3 mg/dL   GFR, Estimated >60 >60 mL/min    Comment: (NOTE) Calculated using the CKD-EPI Creatinine Equation (2021)    Anion gap 10 5 - 15    Comment: Performed at [facility] [address] [place], [place] [address]   No results found.  Review of Systems  Neurological:  Positive for weakness and numbness.    Blood pressure (!) 142/81, pulse 75, temperature 98.2 F (36.8 C), temperature source Oral, resp. rate 17, height 5' 2"$  (1.575 m), weight 84.8 kg, SpO2 96 %. Physical Exam HENT:     Head: Normocephalic.     Right Ear: Tympanic membrane normal.     Nose: Nose normal.     Mouth/Throat:     Mouth: Mucous membranes are moist.  Eyes:     Pupils: Pupils are equal, round, and reactive to light.  Cardiovascular:     Rate and Rhythm: Normal rate.  Pulmonary:     Effort: Pulmonary effort is normal.  Abdominal:     General: Abdomen is flat.  Musculoskeletal:        General: Normal range of motion.     Cervical back: Normal range of motion.  Neurological:     Mental Status: She is alert.     Comments: Patient is awake and alert right upper extremity strength is 5 out of 5 deltoid, bicep, tricep, wrist flexion, wrist extension, hand intrinsics.  Left upper extremity has some weakness 4 to 4+ out of 5 bicep tricep 4+ out of 5 deltoid grip strength is also 4+ out of 5 lower extremity strength is 5 out of 5      Assessment/Plan 82 year old presents for anterior cervical discectomy and fusion C3-4 C4-5  Elaina Hoops, MD 01/26/2023, 8:07 AM

## 2023-01-27 DIAGNOSIS — I10 Essential (primary) hypertension: Secondary | ICD-10-CM | POA: Diagnosis not present

## 2023-01-27 DIAGNOSIS — M4802 Spinal stenosis, cervical region: Secondary | ICD-10-CM | POA: Diagnosis not present

## 2023-01-27 DIAGNOSIS — E039 Hypothyroidism, unspecified: Secondary | ICD-10-CM | POA: Diagnosis not present

## 2023-01-27 DIAGNOSIS — M4712 Other spondylosis with myelopathy, cervical region: Secondary | ICD-10-CM | POA: Diagnosis not present

## 2023-01-27 MED FILL — Thrombin For Soln 5000 Unit: CUTANEOUS | Qty: 2 | Status: AC

## 2023-01-27 NOTE — Plan of Care (Signed)
  Problem: Education: Goal: Ability to verbalize activity precautions or restrictions will improve Outcome: Completed/Met Goal: Knowledge of the prescribed therapeutic regimen will improve Outcome: Completed/Met Goal: Understanding of discharge needs will improve Outcome: Completed/Met   Problem: Activity: Goal: Ability to avoid complications of mobility impairment will improve Outcome: Completed/Met Goal: Ability to tolerate increased activity will improve Outcome: Completed/Met Goal: Will remain free from falls Outcome: Completed/Met   Problem: Bowel/Gastric: Goal: Gastrointestinal status for postoperative course will improve Outcome: Completed/Met   Problem: Clinical Measurements: Goal: Ability to maintain clinical measurements within normal limits will improve Outcome: Completed/Met Goal: Postoperative complications will be avoided or minimized Outcome: Completed/Met Goal: Diagnostic test results will improve Outcome: Completed/Met   Problem: Pain Management: Goal: Pain level will decrease Outcome: Completed/Met   Problem: Skin Integrity: Goal: Will show signs of wound healing Outcome: Completed/Met   Problem: Health Behavior/Discharge Planning: Goal: Identification of resources available to assist in meeting health care needs will improve Outcome: Completed/Met   Problem: Bladder/Genitourinary: Goal: Urinary functional status for postoperative course will improve Outcome: Completed/Met  Patient alert and oriented, void, ambulate, surgical site is clean and dry. D/c instructions explain and given all questions answered. D/c patient home per order.

## 2023-01-27 NOTE — Evaluation (Addendum)
Occupational Therapy Evaluation Patient Details Name: Chelsea Finley MRN: [id number] DOB: 04-04-1941 Today's Date: 01/27/2023   History of Present Illness Pt is a 82 y/o presenting on 2/21 for same day ACDF C3-4, C4-5. PMH includes: HTN, BIL TKA.   Clinical Impression   Patient admitted for above and presents with problem list below.  Pt reports sensation and strength improving in BUEs since surgery, continues to have decreased shoulder ROM on L and decreased [place] bilaterally due to impaired sensation. She completes ADLs with modified independence today, transfers and functional mobility in room without assist.  Educated on ADL compensatory techniques, mobility progression, posture, brace, AE, safety and precautions. She will have spouses support at dc as needed.  Provided squeeze ball and theraputty and educated on exercises.  Will follow acutely while admitted but no further needs after dc home.    Recommendations for follow up therapy are one component of a multi-disciplinary discharge planning process, led by the attending physician.  Recommendations may be updated based on patient status, additional functional criteria and insurance authorization.   Follow Up Recommendations  No OT follow up (outpt OT if needed for hand strength/coordination after followup with MD)     Assistance Recommended at Discharge PRN  Patient can return home with the following Assist for transportation;Assistance with cooking/housework    Functional Status Assessment     Equipment Recommendations  None recommended by OT    Recommendations for Other Services       Precautions / Restrictions Precautions Precautions: Cervical Precaution Booklet Issued: Yes (comment) Precaution Comments: reviewed with pt Required Braces or Orthoses: Cervical Brace Cervical Brace: Soft collar;For comfort Restrictions Weight Bearing Restrictions: No      Mobility Bed Mobility               General bed mobility  comments: OOB upon entry in recliner, described log roll technqiue completed when got OOB    Transfers Overall transfer level: Needs assistance   Transfers: Sit to/from Stand Sit to Stand: Modified independent (Device/Increase time)           General transfer comment: no assist required      Balance Overall balance assessment: Mild deficits observed, not formally tested                                         ADL either performed or assessed with clinical judgement   ADL Overall ADL's : Modified independent                                       General ADL Comments: pt demonstrates ability to complete ADLs without assistance, educated on compensatory techniques and AE recommended (plans to have spouse assist as needed, agreeable to sitting in shower and using long sponge)     Vision   Vision Assessment?: No apparent visual deficits     Perception     Praxis      Pertinent Vitals/Pain Pain Assessment Pain Assessment: Faces Faces Pain Scale: Hurts a little bit Pain Location: incisonal Pain Descriptors / Indicators: Discomfort, Operative site guarding Pain Intervention(s): Limited activity within patient's tolerance, Monitored during session, Repositioned     Hand Dominance Right   Extremity/Trunk Assessment Upper Extremity Assessment Upper Extremity Assessment: RUE deficits/detail;LUE deficits/detail RUE Deficits / Details: numbness hands, grossly 3+/5  within cervical precautions RUE Sensation: decreased light touch RUE Coordination: decreased fine motor LUE Deficits / Details: numbness in hand, grossly 3+/5 but limited shoulder ROM to approx 45* (reports improved from prior to surgery) LUE Sensation: decreased light touch LUE Coordination: decreased fine motor;decreased gross motor   Lower Extremity Assessment Lower Extremity Assessment: Overall WFL for tasks assessed   Cervical / Trunk Assessment Cervical / Trunk  Assessment: Neck Surgery   Communication Communication Communication: No difficulties   Cognition Arousal/Alertness: Awake/alert Behavior During Therapy: WFL for tasks assessed/performed Overall Cognitive Status: Within Functional Limits for tasks assessed                                       General Comments  provided squeeze ball and theraputty, educated on use and exercises    Exercises     Shoulder Instructions      Home Living Family/patient expects to be discharged to:: Private residence Living Arrangements: Spouse/significant other Available Help at Discharge: Family Type of Home: House       Home Layout: One level     Bathroom Shower/Tub: Occupational psychologist: Standard     Home Equipment: BSC/3in1;Shower seat;Grab bars - tub/shower;Hand held Engineering geologist (2 wheels);Rollator (4 wheels)          Prior Functioning/Environment Prior Level of Function : Independent/Modified Independent             Mobility Comments: limited mobility distance due to pain, furniture walking ADLs Comments: managing ADLs with assist from spouse as needed        OT Problem List: Decreased activity tolerance;Decreased strength;Decreased range of motion;Decreased coordination;Decreased knowledge of precautions;Impaired UE functional use;Pain;Impaired sensation      OT Treatment/Interventions:      OT Goals(Current goals can be found in the care plan section) Acute Rehab OT Goals Patient Stated Goal: home OT Goal Formulation: With patient  OT Frequency:      Co-evaluation              AM-PAC OT "6 Clicks" Daily Activity     Outcome Measure Help from another person eating meals?: None Help from another person taking care of personal grooming?: None Help from another person toileting, which includes using toliet, bedpan, or urinal?: None Help from another person bathing (including washing, rinsing, drying)?: None Help from  another person to put on and taking off regular upper body clothing?: None Help from another person to put on and taking off regular lower body clothing?: None 6 Click Score: 24   End of Session Equipment Utilized During Treatment: Cervical collar Nurse Communication: Mobility status;Other (comment) (ready for DC home)  Activity Tolerance: Patient tolerated treatment well Patient left: in chair;with call bell/phone within reach;with family/visitor present  OT Visit Diagnosis: Other abnormalities of gait and mobility (R26.89);Muscle weakness (generalized) (M62.81);Pain Pain - part of body:  (incisional- neck)                Time: [id number] OT Time Calculation (min): 17 min Charges:  OT General Charges $OT Visit: 1 Visit OT Evaluation $OT Eval Low Complexity: 1 Low  Jolaine Artist, OT Acute Rehabilitation Services Office [phone]   Chelsea Finley 01/27/2023, 9:00 AM

## 2023-01-27 NOTE — Discharge Summary (Signed)
Physician Discharge Summary  Patient ID: Chelsea Finley MRN: [id number] DOB/AGE: [birth year] 83 y.o.  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     Discharge Diagnoses: same   Discharged Condition: good  Hospital Course: The patient was admitted on 01/26/2023 and taken to the operating room where the patient underwent acdf C3-4, C4-5. The patient tolerated the procedure well and was taken to the recovery room and then to the floor in stable condition. The hospital course was routine. There were no complications. The wound remained clean dry and intact. Pt had appropriate neck soreness. No complaints of arm pain or new N/T/W. The patient remained afebrile with stable vital signs, and tolerated a regular diet. The patient continued to increase activities, and pain was well controlled with oral pain medications.   Consults: None  Significant Diagnostic Studies:  Results for orders placed or performed during the hospital encounter of 01/24/23  Surgical pcr screen   Specimen: Nasal Mucosa; Nasal Swab  Result Value Ref Range   MRSA, PCR NEGATIVE NEGATIVE   Staphylococcus aureus NEGATIVE NEGATIVE  CBC per protocol  Result Value Ref Range   WBC 6.0 4.0 - 10.5 K/uL   RBC 4.46 3.87 - 5.11 MIL/uL   Hemoglobin 14.0 12.0 - 15.0 g/dL   HCT 41.8 36.0 - 46.0 %   MCV 93.7 80.0 - 100.0 fL   MCH 31.4 26.0 - 34.0 pg   MCHC 33.5 30.0 - 36.0 g/dL   RDW 12.8 11.5 - 15.5 %   Platelets 353 150 - 400 K/uL   nRBC 0.0 0.0 - 0.2 %  Basic metabolic panel per protocol  Result Value Ref Range   Sodium 140 135 - 145 mmol/L   Potassium 3.7 3.5 - 5.1 mmol/L   Chloride 107 98 - 111 mmol/L   CO2 23 22 - 32 mmol/L   Glucose, Bld 106 (H) 70 - 99 mg/dL   BUN 11 8 - 23 mg/dL   Creatinine, Ser 0.81 0.44 - 1.00 mg/dL   Calcium 9.4 8.9 - 10.3 mg/dL   GFR, Estimated >60 >60 mL/min   Anion gap 10 5  - 15    DG Cervical Spine 1 View  Result Date: 01/26/2023 CLINICAL DATA:  Elective surgery EXAM: DG CERVICAL SPINE - 1 VIEW COMPARISON:  MRI 01/14/2023 FINDINGS: Intraoperative images during C3-C5 ACDF. Intact hardware. No evidence of immediate complication. IMPRESSION: Intraoperative images during C3-C5 ACDF.  Intact hardware. Electronically Signed   By: Maurine Simmering M.D.   On: 01/26/2023 11:27   DG C-Arm 1-60 Min-No Report  Result Date: 01/26/2023 Fluoroscopy was utilized by the requesting physician.  No radiographic interpretation.   DG C-Arm 1-60 Min-No Report  Result Date: 01/26/2023 Fluoroscopy was utilized by the requesting physician.  No radiographic interpretation.   MR CERVICAL SPINE WO CONTRAST  Result Date: 01/16/2023 CLINICAL DATA:  Ataxia, nontraumatic cervical pain, myelopathy EXAM: MRI CERVICAL SPINE WITHOUT CONTRAST TECHNIQUE: Multiplanar, multisequence MR imaging of the cervical spine was performed. No intravenous contrast was administered. COMPARISON:  None Available. FINDINGS: Alignment: 2 mm anterolisthesis of C2 on C3 and C4 on C5. 4 mm anterolisthesis of C7 on T1. Vertebrae: No acute fracture, evidence of discitis, or aggressive bone lesion. Cord: Focal compression of the cervical spinal cord at C3-4 with cord edema. Posterior Fossa, vertebral arteries, paraspinal tissues: Posterior fossa demonstrates no focal abnormality. Vertebral artery flow voids are maintained. Paraspinal soft tissues are unremarkable.  Disc levels: Discs: Degenerative disease with disc height loss C4-5, C5-6, C6-7, C7-T1 and T1-2. C2-3: Minimal broad-based disc bulge. Moderate left and severe right facet arthropathy. Bilateral uncovertebral degenerative changes. Moderate-severe left foraminal stenosis. Severe right foraminal stenosis. Mild spinal stenosis. C3-4: Broad-based disc bulge with a left paracentral disc osteophyte complex deforming the ventral cervical spinal cord with associated cord edema.  Severe spinal stenosis. Bilateral uncovertebral degenerative changes. Severe right foraminal stenosis. Severe left foraminal stenosis. Moderate bilateral facet arthropathy. C4-5: Broad-based disc osteophyte complex. Bilateral uncovertebral degenerative changes. Severe right and moderate left facet arthropathy. Severe spinal stenosis. Severe bilateral foraminal stenosis. C5-6: Broad-based disc osteophyte complex. Moderate bilateral facet arthropathy. Bilateral uncovertebral degenerative changes. Severe bilateral foraminal stenosis. Moderate spinal stenosis. C6-7: Broad-based disc bulge. Bilateral uncovertebral degenerative changes. Severe bilateral foraminal stenosis. Minimal spinal stenosis. C7-T1: Broad-based disc bulge. Severe bilateral facet arthropathy. Bilateral uncovertebral degenerative changes. Severe bilateral foraminal stenosis. IMPRESSION: 1. At C3-4 there is a broad-based disc bulge with a left paracentral disc osteophyte complex deforming the ventral cervical spinal cord with associated cord edema. Severe spinal stenosis. Bilateral uncovertebral degenerative changes. Severe right foraminal stenosis. Severe left foraminal stenosis. Moderate bilateral facet arthropathy. 2. At C4-5 there is a broad-based disc osteophyte complex. Bilateral uncovertebral degenerative changes. Severe right and moderate left facet arthropathy. Severe spinal stenosis. Severe bilateral foraminal stenosis. 3. At C5-6 there is a broad-based disc osteophyte complex. Moderate bilateral facet arthropathy. Bilateral uncovertebral degenerative changes. Severe bilateral foraminal stenosis. Moderate spinal stenosis. 4. At C6-7 there is a broad-based disc bulge. Bilateral uncovertebral degenerative changes. Severe bilateral foraminal stenosis. Minimal spinal stenosis. 5. At C7-T1 there is a broad-based disc bulge. Severe bilateral facet arthropathy. Bilateral uncovertebral degenerative changes. Severe bilateral foraminal stenosis.  Electronically Signed   By: Kathreen Devoid M.D.   On: 01/16/2023 10:42    Antibiotics:  Anti-infectives (From admission, onward)    Start     Dose/Rate Route Frequency Ordered Stop   01/26/23 1700  ceFAZolin (ANCEF) IVPB 2g/100 mL premix        2 g 200 mL/hr over 30 Minutes Intravenous Every 8 hours 01/26/23 1353 01/27/23 0052   01/26/23 1430  ceFAZolin (ANCEF) IVPB 2g/100 mL premix  Status:  Discontinued        2 g 200 mL/hr over 30 Minutes Intravenous Every 8 hours 01/26/23 1341 01/26/23 1353   01/26/23 0806  ceFAZolin (ANCEF) 2-4 GM/100ML-% IVPB       Note to Pharmacy: Theodoro Grist M: cabinet override      01/26/23 0806 01/26/23 2014       Discharge Exam: Blood pressure (!) 151/88, pulse 79, temperature 98.4 F (36.9 C), temperature source Oral, resp. rate 18, height '5\' 2"'$  (1.575 m), weight 84.8 kg, SpO2 100 %. Neurologic: Grossly normal Ambulating and voiding well incision cdi   Discharge Medications:   Allergies as of 01/27/2023       Reactions   Morphine And Related Nausea And Vomiting        Medication List     TAKE these medications    amLODipine 5 MG tablet Commonly known as: NORVASC TAKE ONE (1) TABLET EACH DAY   lisinopril 20 MG tablet Commonly known as: ZESTRIL TAKE ONE (1) TABLET EACH DAY   meclizine 50 MG tablet Commonly known as: ANTIVERT Take 1 tablet (50 mg total) by mouth 3 (three) times daily as needed.   multivitamin with minerals Tabs tablet Take 1 tablet by mouth daily.   mupirocin cream 2 % Commonly  known as: BACTROBAN Apply 1 application topically 2 (two) times daily.   pravastatin 80 MG tablet Commonly known as: PRAVACHOL Take 1 tablet (80 mg total) by mouth daily.   Synthroid 125 MCG tablet Generic drug: levothyroxine TAKE ONE TABLET EACH MORNING BEFORE BREAKFAST               Discharge Care Instructions  (From admission, onward)           Start     Ordered   01/27/23 0000  If the dressing is still on your  incision site when you go home, remove it on the third day after your surgery date. Remove dressing if it begins to fall off, or if it is dirty or damaged before the third day.        01/27/23 0750            Disposition: home   Final Dx: acdf C3-4, C4-5  Discharge Instructions     Call MD for:  difficulty breathing, headache or visual disturbances   Complete by: As directed    Call MD for:  hives   Complete by: As directed    Call MD for:  persistant dizziness or light-headedness   Complete by: As directed    Call MD for:  persistant nausea and vomiting   Complete by: As directed    Call MD for:  redness, tenderness, or signs of infection (pain, swelling, redness, odor or green/yellow discharge around incision site)   Complete by: As directed    Call MD for:  severe uncontrolled pain   Complete by: As directed    Call MD for:  temperature >100.4   Complete by: As directed    Diet - low sodium heart healthy   Complete by: As directed    Driving Restrictions   Complete by: As directed    No driving for 2 weeks, no riding in the car for 1 week   If the dressing is still on your incision site when you go home, remove it on the third day after your surgery date. Remove dressing if it begins to fall off, or if it is dirty or damaged before the third day.   Complete by: As directed    Increase activity slowly   Complete by: As directed    Lifting restrictions   Complete by: As directed    No lifting more than 8 lbs          Signed: Ocie Cornfield [facility] 01/27/2023, 7:51 AM

## 2023-01-27 NOTE — Anesthesia Postprocedure Evaluation (Signed)
Anesthesia Post Note  Patient: LOETTE FAUERBACH  Procedure(s) Performed: ANTERIOR CERVICAL DECOMPRESSION/DISECTOMY FUSION CERVICAL THREE-CERVICAL FOUR - CERVICAL FOUR-CERIVACL FIVE     Patient location during evaluation: PACU Anesthesia Type: General Level of consciousness: sedated and patient cooperative Pain management: pain level controlled Vital Signs Assessment: post-procedure vital signs reviewed and stable Respiratory status: spontaneous breathing Cardiovascular status: stable Anesthetic complications: no   No notable events documented.  Last Vitals:  Vitals:   01/27/23 0610 01/27/23 0721  BP: 117/82 (!) 151/88  Pulse: 81 79  Resp: 17 18  Temp: 36.9 C 36.9 C  SpO2: 98% 100%    Last Pain:  Vitals:   01/27/23 0817  TempSrc:   PainSc: 0-No pain                 Nolon Nations

## 2023-02-01 ENCOUNTER — Encounter (HOSPITAL_COMMUNITY): Payer: Self-pay | Admitting: Neurosurgery

## 2023-02-16 ENCOUNTER — Ambulatory Visit: Payer: Medicare PPO | Admitting: Diagnostic Neuroimaging

## 2023-03-01 DIAGNOSIS — M4802 Spinal stenosis, cervical region: Secondary | ICD-10-CM | POA: Diagnosis not present

## 2023-03-04 ENCOUNTER — Encounter (INDEPENDENT_AMBULATORY_CARE_PROVIDER_SITE_OTHER): Payer: Medicare PPO | Admitting: Ophthalmology

## 2023-03-04 DIAGNOSIS — H348312 Tributary (branch) retinal vein occlusion, right eye, stable: Secondary | ICD-10-CM

## 2023-03-04 DIAGNOSIS — I10 Essential (primary) hypertension: Secondary | ICD-10-CM

## 2023-03-04 DIAGNOSIS — H353132 Nonexudative age-related macular degeneration, bilateral, intermediate dry stage: Secondary | ICD-10-CM | POA: Diagnosis not present

## 2023-03-04 DIAGNOSIS — H35033 Hypertensive retinopathy, bilateral: Secondary | ICD-10-CM | POA: Diagnosis not present

## 2023-03-04 DIAGNOSIS — H43813 Vitreous degeneration, bilateral: Secondary | ICD-10-CM

## 2023-04-07 DIAGNOSIS — M4802 Spinal stenosis, cervical region: Secondary | ICD-10-CM | POA: Diagnosis not present

## 2023-05-13 ENCOUNTER — Encounter (INDEPENDENT_AMBULATORY_CARE_PROVIDER_SITE_OTHER): Payer: Medicare PPO | Admitting: Ophthalmology

## 2023-05-13 DIAGNOSIS — I10 Essential (primary) hypertension: Secondary | ICD-10-CM

## 2023-05-13 DIAGNOSIS — H348312 Tributary (branch) retinal vein occlusion, right eye, stable: Secondary | ICD-10-CM | POA: Diagnosis not present

## 2023-05-13 DIAGNOSIS — H35033 Hypertensive retinopathy, bilateral: Secondary | ICD-10-CM

## 2023-05-13 DIAGNOSIS — H43813 Vitreous degeneration, bilateral: Secondary | ICD-10-CM

## 2023-06-14 DIAGNOSIS — M4802 Spinal stenosis, cervical region: Secondary | ICD-10-CM | POA: Diagnosis not present

## 2023-06-28 ENCOUNTER — Ambulatory Visit: Payer: Medicare PPO | Admitting: Family Medicine

## 2023-06-28 ENCOUNTER — Encounter: Payer: Self-pay | Admitting: Family Medicine

## 2023-06-28 VITALS — BP 139/80 | HR 75 | Temp 97.8°F | Ht 62.0 in | Wt 176.6 lb

## 2023-06-28 DIAGNOSIS — I10 Essential (primary) hypertension: Secondary | ICD-10-CM | POA: Diagnosis not present

## 2023-06-28 DIAGNOSIS — E039 Hypothyroidism, unspecified: Secondary | ICD-10-CM | POA: Diagnosis not present

## 2023-06-28 DIAGNOSIS — E782 Mixed hyperlipidemia: Secondary | ICD-10-CM | POA: Diagnosis not present

## 2023-06-28 DIAGNOSIS — E7841 Elevated Lipoprotein(a): Secondary | ICD-10-CM | POA: Diagnosis not present

## 2023-06-28 LAB — LIPID PANEL

## 2023-06-28 LAB — CBC WITH DIFFERENTIAL/PLATELET
Basos: 1 %
EOS (ABSOLUTE): 0.2 10*3/uL (ref 0.0–0.4)
Hematocrit: 42.5 % (ref 34.0–46.6)
Hemoglobin: 14.3 g/dL (ref 11.1–15.9)
MCH: 31.2 pg (ref 26.6–33.0)
MCHC: 33.6 g/dL (ref 31.5–35.7)
Monocytes Absolute: 0.5 10*3/uL (ref 0.1–0.9)
Neutrophils Absolute: 2.4 10*3/uL (ref 1.4–7.0)
Platelets: 339 10*3/uL (ref 150–450)
RBC: 4.58 x10E6/uL (ref 3.77–5.28)

## 2023-06-28 LAB — CMP14+EGFR: Potassium: 4 mmol/L (ref 3.5–5.2)

## 2023-06-28 MED ORDER — LISINOPRIL 20 MG PO TABS: ORAL_TABLET | ORAL | 3 refills | Status: DC

## 2023-06-28 MED ORDER — PRAVASTATIN SODIUM 80 MG PO TABS: 80.0000 mg | ORAL_TABLET | Freq: Every day | ORAL | 3 refills | Status: DC

## 2023-06-28 MED ORDER — SYNTHROID 125 MCG PO TABS: ORAL_TABLET | ORAL | 3 refills | Status: DC

## 2023-06-28 MED ORDER — AMLODIPINE BESYLATE 5 MG PO TABS: ORAL_TABLET | ORAL | 3 refills | Status: DC

## 2023-06-28 NOTE — Progress Notes (Signed)
Subjective:  Patient ID: Chelsea Finley, female    DOB: 08/04/1941  Age: 82 y.o. MRN: [id number]  CC: Medical Management of Chronic Issues   HPI Chelsea Finley presents for  follow-up of hypertension. Patient has no history of headache chest pain or shortness of breath or recent cough. Patient also denies symptoms of TIA such as focal numbness or weakness. Patient denies side effects from medication. States taking it regularly.  Neck much better. Anesthesiology said they heard a murmur.   . follow-up on  thyroid. The patient has a history of hypothyroidism for many years. It has been stable recently. Pt. denies any change in  voice, loss of hair, heat or cold intolerance. Energy level has been adequate to good. Patient denies constipation and diarrhea. No myxedema. Medication is as noted below. Verified that pt is taking it daily on an empty stomach. Well tolerated.   History Chelsea Finley has a past medical history of Arthritis of knee, left (02/01/2012), Elevated lipoprotein(a) (12/16/2019), Hyperlipidemia, Hypertension, and Hypothyroidism.   She has a past surgical history that includes Rotator cuff repair (05/2010); Knee Arthroplasty (11/16/2011); Appendectomy; Tubal ligation; Knee Arthroplasty (02/01/2012); and Anterior cervical decomp/discectomy fusion (N/A, 01/26/2023).   Her family history includes Cancer in her father; Depression in her mother.She reports that she has never smoked. She has never used smokeless tobacco. She reports that she does not drink alcohol and does not use drugs.  Current Outpatient Medications on File Prior to Visit  Medication Sig Dispense Refill   Multiple Vitamin (MULTIVITAMIN WITH MINERALS) TABS tablet Take 1 tablet by mouth daily.     No current facility-administered medications on file prior to visit.    ROS Review of Systems  Constitutional: Negative.   HENT: Negative.    Eyes:  Negative for visual disturbance.  Respiratory:  Negative for shortness of  breath.   Cardiovascular:  Negative for chest pain.  Gastrointestinal:  Negative for abdominal pain.  Musculoskeletal:  Negative for arthralgias.    Objective:  BP 139/80   Pulse 75   Temp 97.8 F (36.6 C)   Ht 5\' 2"  (1.575 m)   Wt 176 lb 10.1 oz (80.1 kg)   SpO2 98%   BMI 32.31 kg/m   BP Readings from Last 3 Encounters:  06/28/23 139/80  01/27/23 (!) 151/88  01/24/23 (!) 162/84    Wt Readings from Last 3 Encounters:  06/28/23 176 lb 10.1 oz (80.1 kg)  01/26/23 187 lb (84.8 kg)  01/24/23 187 lb (84.8 kg)     Physical Exam Constitutional:      General: She is not in acute distress.    Appearance: She is well-developed.  Cardiovascular:     Rate and Rhythm: Normal rate and regular rhythm.  Pulmonary:     Breath sounds: Normal breath sounds.  Musculoskeletal:        General: Normal range of motion.  Skin:    General: Skin is warm and dry.  Neurological:     Mental Status: She is alert and oriented to person, place, and time.       Assessment & Plan:   Chelsea Finley was seen today for medical management of chronic issues.  Diagnoses and all orders for this visit:  Hypothyroidism, unspecified type -     TSH + free T4 -     SYNTHROID 125 MCG tablet; TAKE ONE TABLET EACH MORNING BEFORE BREAKFAST  Essential hypertension -     CBC with Differential/Platelet -     CMP14+EGFR -  amLODipine (NORVASC) 5 MG tablet; TAKE ONE (1) TABLET EACH DAY -     lisinopril (ZESTRIL) 20 MG tablet; TAKE ONE (1) TABLET EACH DAY  Mixed hyperlipidemia -     Lipid panel  Elevated lipoprotein(a) -     pravastatin (PRAVACHOL) 80 MG tablet; Take 1 tablet (80 mg total) by mouth daily.   Allergies as of 06/28/2023       Reactions   Morphine And Codeine Nausea And Vomiting        Medication List        Accurate as of June 28, 2023  8:48 AM. If you have any questions, ask your nurse or doctor.          STOP taking these medications    meclizine 50 MG tablet Commonly  known as: ANTIVERT Stopped by: Travion Ke   mupirocin cream 2 % Commonly known as: BACTROBAN Stopped by: Jin Shockley       TAKE these medications    amLODipine 5 MG tablet Commonly known as: NORVASC TAKE ONE (1) TABLET EACH DAY   lisinopril 20 MG tablet Commonly known as: ZESTRIL TAKE ONE (1) TABLET EACH DAY   multivitamin with minerals Tabs tablet Take 1 tablet by mouth daily.   pravastatin 80 MG tablet Commonly known as: PRAVACHOL Take 1 tablet (80 mg total) by mouth daily.   Synthroid 125 MCG tablet Generic drug: levothyroxine TAKE ONE TABLET EACH MORNING BEFORE BREAKFAST        Meds ordered this encounter  Medications   amLODipine (NORVASC) 5 MG tablet    Sig: TAKE ONE (1) TABLET EACH DAY    Dispense:  90 tablet    Refill:  3   lisinopril (ZESTRIL) 20 MG tablet    Sig: TAKE ONE (1) TABLET EACH DAY    Dispense:  90 tablet    Refill:  3   pravastatin (PRAVACHOL) 80 MG tablet    Sig: Take 1 tablet (80 mg total) by mouth daily.    Dispense:  90 tablet    Refill:  3   SYNTHROID 125 MCG tablet    Sig: TAKE ONE TABLET EACH MORNING BEFORE BREAKFAST    Dispense:  90 tablet    Refill:  3    Follow-up: No follow-ups on file.  Mechele Claude, M.D.

## 2023-06-29 LAB — CMP14+EGFR
ALT: 14 IU/L (ref 0–32)
AST: 25 IU/L (ref 0–40)
Albumin: 4.5 g/dL (ref 3.7–4.7)
Bilirubin Total: 0.4 mg/dL (ref 0.0–1.2)
CO2: 20 mmol/L (ref 20–29)
Calcium: 9.6 mg/dL (ref 8.7–10.3)
Chloride: 104 mmol/L (ref 96–106)
Globulin, Total: 2.4 g/dL (ref 1.5–4.5)
Glucose: 94 mg/dL (ref 70–99)
Sodium: 139 mmol/L (ref 134–144)
eGFR: 73 mL/min/{1.73_m2} (ref >59)

## 2023-06-29 LAB — CBC WITH DIFFERENTIAL/PLATELET
Basophils Absolute: 0 10*3/uL (ref 0.0–0.2)
Eos: 3 %
Immature Grans (Abs): 0 10*3/uL (ref 0.0–0.1)
Immature Granulocytes: 0 %
Lymphocytes Absolute: 2.3 10*3/uL (ref 0.7–3.1)
Lymphs: 42 %
MCV: 93 fL (ref 79–97)
Monocytes: 10 %
Neutrophils: 44 %
RDW: 12.8 % (ref 11.7–15.4)
WBC: 5.4 10*3/uL (ref 3.4–10.8)

## 2023-06-29 LAB — TSH+FREE T4
Free T4: 1.72 ng/dL (ref 0.82–1.77)
TSH: 0.652 u[IU]/mL (ref 0.450–4.500)

## 2023-06-29 LAB — LIPID PANEL
HDL: 64 mg/dL (ref >39)
LDL Chol Calc (NIH): 99 mg/dL (ref 0–99)
VLDL Cholesterol Cal: 15 mg/dL (ref 5–40)

## 2023-06-29 NOTE — Progress Notes (Signed)
Hello Chemere,  Your lab result is normal and/or stable.Some minor variations that are not significant are commonly marked abnormal, but do not represent any medical problem for you.  Best regards, Warren Stacks, M.D.

## 2023-06-30 ENCOUNTER — Telehealth: Payer: Self-pay | Admitting: Family Medicine

## 2023-06-30 NOTE — Telephone Encounter (Signed)
Left message on home number making pt aware of normal labs and to call back if needed.

## 2023-07-29 ENCOUNTER — Encounter (INDEPENDENT_AMBULATORY_CARE_PROVIDER_SITE_OTHER): Payer: Medicare PPO | Admitting: Ophthalmology

## 2023-07-29 DIAGNOSIS — I10 Essential (primary) hypertension: Secondary | ICD-10-CM | POA: Diagnosis not present

## 2023-07-29 DIAGNOSIS — H35033 Hypertensive retinopathy, bilateral: Secondary | ICD-10-CM | POA: Diagnosis not present

## 2023-07-29 DIAGNOSIS — H43813 Vitreous degeneration, bilateral: Secondary | ICD-10-CM

## 2023-07-29 DIAGNOSIS — H353132 Nonexudative age-related macular degeneration, bilateral, intermediate dry stage: Secondary | ICD-10-CM

## 2023-07-29 DIAGNOSIS — H348312 Tributary (branch) retinal vein occlusion, right eye, stable: Secondary | ICD-10-CM | POA: Diagnosis not present

## 2023-10-27 ENCOUNTER — Encounter (INDEPENDENT_AMBULATORY_CARE_PROVIDER_SITE_OTHER): Payer: Medicare PPO | Admitting: Ophthalmology

## 2023-10-27 DIAGNOSIS — I10 Essential (primary) hypertension: Secondary | ICD-10-CM | POA: Diagnosis not present

## 2023-10-27 DIAGNOSIS — H353132 Nonexudative age-related macular degeneration, bilateral, intermediate dry stage: Secondary | ICD-10-CM | POA: Diagnosis not present

## 2023-10-27 DIAGNOSIS — H43813 Vitreous degeneration, bilateral: Secondary | ICD-10-CM | POA: Diagnosis not present

## 2023-10-27 DIAGNOSIS — H348312 Tributary (branch) retinal vein occlusion, right eye, stable: Secondary | ICD-10-CM | POA: Diagnosis not present

## 2023-10-27 DIAGNOSIS — H35033 Hypertensive retinopathy, bilateral: Secondary | ICD-10-CM

## 2023-10-28 ENCOUNTER — Encounter (INDEPENDENT_AMBULATORY_CARE_PROVIDER_SITE_OTHER): Payer: Medicare PPO | Admitting: Ophthalmology

## 2023-12-29 ENCOUNTER — Ambulatory Visit: Payer: Medicare PPO | Admitting: Family Medicine

## 2023-12-29 ENCOUNTER — Encounter: Payer: Self-pay | Admitting: Family Medicine

## 2023-12-29 VITALS — BP 137/70 | HR 68 | Temp 97.4°F | Ht 62.0 in | Wt 172.0 lb

## 2023-12-29 DIAGNOSIS — E039 Hypothyroidism, unspecified: Secondary | ICD-10-CM

## 2023-12-29 DIAGNOSIS — Z780 Asymptomatic menopausal state: Secondary | ICD-10-CM | POA: Diagnosis not present

## 2023-12-29 DIAGNOSIS — E782 Mixed hyperlipidemia: Secondary | ICD-10-CM | POA: Diagnosis not present

## 2023-12-29 DIAGNOSIS — I10 Essential (primary) hypertension: Secondary | ICD-10-CM | POA: Diagnosis not present

## 2023-12-29 NOTE — Progress Notes (Signed)
Subjective:  Patient ID: Chelsea Finley, female    DOB: 07/25/1941  Age: 83 y.o. MRN: [id number]  CC: Medical Management of Chronic Issues (No concerns at this time. )   HPI KADIE SEISER presents for  follow-up of hypertension. Patient has no history of headache chest pain or shortness of breath or recent cough. Patient also denies symptoms of TIA such as focal numbness or weakness. Patient denies side effects from medication. States taking it regularly.  follow-up on  thyroid. The patient has a history of hypothyroidism for many years. It has been stable recently. Pt. denies any change in  voice, loss of hair, heat or cold intolerance. Energy level has been adequate to good. Patient denies constipation and diarrhea. No myxedema. Medication is as noted below. Verified that pt is taking it daily on an empty stomach. Well tolerated.  in for follow-up of elevated cholesterol. Doing well without complaints on current medication. Denies side effects of statin including myalgia and arthralgia and nausea. Currently no chest pain, shortness of breath or other cardiovascular related symptoms noted.   History Kylena has a past medical history of Arthritis of knee, left (02/01/2012), Elevated lipoprotein(a) (12/16/2019), Hyperlipidemia, Hypertension, and Hypothyroidism.   She has a past surgical history that includes Rotator cuff repair (05/2010); Knee Arthroplasty (11/16/2011); Appendectomy; Tubal ligation; Knee Arthroplasty (02/01/2012); and Anterior cervical decomp/discectomy fusion (N/A, 01/26/2023).   Her family history includes Cancer in her father; Depression in her mother.She reports that she has never smoked. She has never used smokeless tobacco. She reports that she does not drink alcohol and does not use drugs.  Current Outpatient Medications on File Prior to Visit  Medication Sig Dispense Refill   amLODipine (NORVASC) 5 MG tablet TAKE ONE (1) TABLET EACH DAY 90 tablet 3   lisinopril  (ZESTRIL) 20 MG tablet TAKE ONE (1) TABLET EACH DAY 90 tablet 3   Multiple Vitamin (MULTIVITAMIN WITH MINERALS) TABS tablet Take 1 tablet by mouth daily.     pravastatin (PRAVACHOL) 80 MG tablet Take 1 tablet (80 mg total) by mouth daily. 90 tablet 3   SYNTHROID 125 MCG tablet TAKE ONE TABLET EACH MORNING BEFORE BREAKFAST 90 tablet 3   No current facility-administered medications on file prior to visit.    ROS Review of Systems  Constitutional: Negative.   HENT: Negative.    Eyes:  Negative for visual disturbance.  Respiratory:  Negative for shortness of breath.   Cardiovascular:  Negative for chest pain.  Gastrointestinal:  Negative for abdominal pain.  Musculoskeletal:  Negative for arthralgias.    Objective:  BP 137/70   Pulse 68   Temp (!) 97.4 F (36.3 C)   Ht 5\' 2"  (1.575 m)   Wt 172 lb (78 kg)   SpO2 99%   BMI 31.46 kg/m   BP Readings from Last 3 Encounters:  12/29/23 137/70  06/28/23 139/80  01/27/23 (!) 151/88    Wt Readings from Last 3 Encounters:  12/29/23 172 lb (78 kg)  06/28/23 176 lb 10.1 oz (80.1 kg)  01/26/23 187 lb (84.8 kg)     Physical Exam Constitutional:      General: She is not in acute distress.    Appearance: She is well-developed.  Cardiovascular:     Rate and Rhythm: Normal rate and regular rhythm.  Pulmonary:     Breath sounds: Normal breath sounds.  Musculoskeletal:        General: Normal range of motion.  Skin:    General: Skin  is warm and dry.  Neurological:     Mental Status: She is alert and oriented to person, place, and time.       Assessment & Plan:   Hayleigh was seen today for medical management of chronic issues.  Diagnoses and all orders for this visit:  Hypothyroidism, unspecified type  Essential hypertension -     CBC with Differential/Platelet -     CMP14+EGFR -     Lipid panel -     TSH + free T4  Mixed hyperlipidemia -     Lipid panel -     TSH + free T4  Postmenopausal -     DG Bone Density;  Future   Allergies as of 12/29/2023       Reactions   Morphine And Codeine Nausea And Vomiting        Medication List        Accurate as of December 29, 2023  1:37 PM. If you have any questions, ask your nurse or doctor.          amLODipine 5 MG tablet Commonly known as: NORVASC TAKE ONE (1) TABLET EACH DAY   lisinopril 20 MG tablet Commonly known as: ZESTRIL TAKE ONE (1) TABLET EACH DAY   multivitamin with minerals Tabs tablet Take 1 tablet by mouth daily.   pravastatin 80 MG tablet Commonly known as: PRAVACHOL Take 1 tablet (80 mg total) by mouth daily.   Synthroid 125 MCG tablet Generic drug: levothyroxine TAKE ONE TABLET EACH MORNING BEFORE BREAKFAST        No orders of the defined types were placed in this encounter.   ***  Follow-up: Return in about 6 months (around 06/27/2024).  Mechele Claude, M.D.

## 2023-12-30 ENCOUNTER — Encounter: Payer: Self-pay | Admitting: Family Medicine

## 2023-12-30 LAB — CMP14+EGFR
ALT: 16 [IU]/L (ref 0–32)
AST: 24 [IU]/L (ref 0–40)
Albumin: 4.5 g/dL (ref 3.7–4.7)
Alkaline Phosphatase: 94 [IU]/L (ref 44–121)
BUN/Creatinine Ratio: 16 (ref 12–28)
BUN: 12 mg/dL (ref 8–27)
Bilirubin Total: 0.3 mg/dL (ref 0.0–1.2)
CO2: 20 mmol/L (ref 20–29)
Calcium: 10.3 mg/dL (ref 8.7–10.3)
Chloride: 103 mmol/L (ref 96–106)
Creatinine, Ser: 0.76 mg/dL (ref 0.57–1.00)
Globulin, Total: 2.6 g/dL (ref 1.5–4.5)
Glucose: 92 mg/dL (ref 70–99)
Potassium: 4.4 mmol/L (ref 3.5–5.2)
Sodium: 141 mmol/L (ref 134–144)
Total Protein: 7.1 g/dL (ref 6.0–8.5)
eGFR: 77 mL/min/{1.73_m2} (ref >59)

## 2023-12-30 LAB — CBC WITH DIFFERENTIAL/PLATELET
Basophils Absolute: 0 10*3/uL (ref 0.0–0.2)
Basos: 1 %
EOS (ABSOLUTE): 0.2 10*3/uL (ref 0.0–0.4)
Eos: 2 %
Hematocrit: 45.2 % (ref 34.0–46.6)
Hemoglobin: 14.5 g/dL (ref 11.1–15.9)
Immature Grans (Abs): 0 10*3/uL (ref 0.0–0.1)
Immature Granulocytes: 1 %
Lymphocytes Absolute: 2.6 10*3/uL (ref 0.7–3.1)
Lymphs: 39 %
MCH: 30.6 pg (ref 26.6–33.0)
MCHC: 32.1 g/dL (ref 31.5–35.7)
MCV: 95 fL (ref 79–97)
Monocytes Absolute: 0.8 10*3/uL (ref 0.1–0.9)
Monocytes: 12 %
Neutrophils Absolute: 3.1 10*3/uL (ref 1.4–7.0)
Neutrophils: 45 %
Platelets: 373 10*3/uL (ref 150–450)
RBC: 4.74 x10E6/uL (ref 3.77–5.28)
RDW: 12.1 % (ref 11.7–15.4)
WBC: 6.6 10*3/uL (ref 3.4–10.8)

## 2023-12-30 LAB — LIPID PANEL
Chol/HDL Ratio: 2.3 {ratio} (ref 0.0–4.4)
Cholesterol, Total: 168 mg/dL (ref 100–199)
HDL: 73 mg/dL (ref >39)
LDL Chol Calc (NIH): 82 mg/dL (ref 0–99)
Triglycerides: 65 mg/dL (ref 0–149)
VLDL Cholesterol Cal: 13 mg/dL (ref 5–40)

## 2023-12-30 LAB — TSH+FREE T4
Free T4: 1.52 ng/dL (ref 0.82–1.77)
TSH: 0.571 u[IU]/mL (ref 0.450–4.500)

## 2023-12-30 NOTE — Progress Notes (Signed)
Hello Kynsli,  Your lab result is normal and/or stable.Some minor variations that are not significant are commonly marked abnormal, but do not represent any medical problem for you.  Best regards, Mechele Claude, M.D.

## 2024-02-09 ENCOUNTER — Encounter (INDEPENDENT_AMBULATORY_CARE_PROVIDER_SITE_OTHER): Payer: Medicare PPO | Admitting: Ophthalmology

## 2024-02-09 DIAGNOSIS — I10 Essential (primary) hypertension: Secondary | ICD-10-CM | POA: Diagnosis not present

## 2024-02-09 DIAGNOSIS — H34831 Tributary (branch) retinal vein occlusion, right eye, with macular edema: Secondary | ICD-10-CM | POA: Diagnosis not present

## 2024-02-09 DIAGNOSIS — H353132 Nonexudative age-related macular degeneration, bilateral, intermediate dry stage: Secondary | ICD-10-CM

## 2024-02-09 DIAGNOSIS — H43813 Vitreous degeneration, bilateral: Secondary | ICD-10-CM

## 2024-02-09 DIAGNOSIS — H35033 Hypertensive retinopathy, bilateral: Secondary | ICD-10-CM

## 2024-05-22 ENCOUNTER — Other Ambulatory Visit: Payer: Self-pay | Admitting: Family Medicine

## 2024-05-22 DIAGNOSIS — E7841 Elevated Lipoprotein(a): Secondary | ICD-10-CM

## 2024-06-05 ENCOUNTER — Encounter (INDEPENDENT_AMBULATORY_CARE_PROVIDER_SITE_OTHER): Admitting: Ophthalmology

## 2024-06-05 DIAGNOSIS — H34831 Tributary (branch) retinal vein occlusion, right eye, with macular edema: Secondary | ICD-10-CM

## 2024-06-05 DIAGNOSIS — H353132 Nonexudative age-related macular degeneration, bilateral, intermediate dry stage: Secondary | ICD-10-CM

## 2024-06-05 DIAGNOSIS — I10 Essential (primary) hypertension: Secondary | ICD-10-CM | POA: Diagnosis not present

## 2024-06-05 DIAGNOSIS — H43813 Vitreous degeneration, bilateral: Secondary | ICD-10-CM

## 2024-06-05 DIAGNOSIS — H35033 Hypertensive retinopathy, bilateral: Secondary | ICD-10-CM | POA: Diagnosis not present

## 2024-06-06 ENCOUNTER — Encounter (INDEPENDENT_AMBULATORY_CARE_PROVIDER_SITE_OTHER): Admitting: Ophthalmology

## 2024-06-19 ENCOUNTER — Ambulatory Visit (INDEPENDENT_AMBULATORY_CARE_PROVIDER_SITE_OTHER): Payer: Medicare PPO

## 2024-06-19 ENCOUNTER — Ambulatory Visit (INDEPENDENT_AMBULATORY_CARE_PROVIDER_SITE_OTHER): Payer: Medicare PPO | Admitting: Family Medicine

## 2024-06-19 ENCOUNTER — Other Ambulatory Visit: Payer: Self-pay | Admitting: Family Medicine

## 2024-06-19 ENCOUNTER — Encounter: Payer: Self-pay | Admitting: Family Medicine

## 2024-06-19 ENCOUNTER — Telehealth: Payer: Self-pay | Admitting: Family Medicine

## 2024-06-19 ENCOUNTER — Ambulatory Visit: Payer: Self-pay | Admitting: Family Medicine

## 2024-06-19 VITALS — BP 129/75 | HR 60 | Temp 98.2°F | Ht 62.0 in | Wt 175.0 lb

## 2024-06-19 DIAGNOSIS — Z780 Asymptomatic menopausal state: Secondary | ICD-10-CM | POA: Diagnosis not present

## 2024-06-19 DIAGNOSIS — E782 Mixed hyperlipidemia: Secondary | ICD-10-CM

## 2024-06-19 DIAGNOSIS — G629 Polyneuropathy, unspecified: Secondary | ICD-10-CM

## 2024-06-19 DIAGNOSIS — I10 Essential (primary) hypertension: Secondary | ICD-10-CM | POA: Diagnosis not present

## 2024-06-19 DIAGNOSIS — E039 Hypothyroidism, unspecified: Secondary | ICD-10-CM

## 2024-06-19 DIAGNOSIS — M810 Age-related osteoporosis without current pathological fracture: Secondary | ICD-10-CM | POA: Diagnosis not present

## 2024-06-19 DIAGNOSIS — E7841 Elevated Lipoprotein(a): Secondary | ICD-10-CM | POA: Diagnosis not present

## 2024-06-19 MED ORDER — AMLODIPINE BESYLATE 5 MG PO TABS: ORAL_TABLET | ORAL | 3 refills | Status: AC

## 2024-06-19 MED ORDER — PREGABALIN 50 MG PO CAPS: ORAL_CAPSULE | ORAL | 0 refills | Status: DC

## 2024-06-19 MED ORDER — SYNTHROID 125 MCG PO TABS: ORAL_TABLET | ORAL | 3 refills | Status: AC

## 2024-06-19 MED ORDER — PRAVASTATIN SODIUM 80 MG PO TABS: 80.0000 mg | ORAL_TABLET | Freq: Every day | ORAL | 3 refills | Status: AC

## 2024-06-19 MED ORDER — LISINOPRIL 20 MG PO TABS: ORAL_TABLET | ORAL | 3 refills | Status: AC

## 2024-06-19 NOTE — Progress Notes (Signed)
 Subjective:  Patient ID: Chelsea Finley, female    DOB: Oct 11, 1941  Age: 83 y.o. MRN: [id number]  CC: Medical Management of Chronic Issues (Since neck surgery for has constant numbness and tingling in both arms. Left is worse. )   HPI [name] presents for  follow-up of hypertension. Patient has no history of headache chest pain or shortness of breath or recent cough. Patient also denies symptoms of TIA such as focal numbness or weakness. Patient denies side effects from medication. States taking it regularly.   in for follow-up of elevated cholesterol. Doing well without complaints on current medication. Denies side effects of statin including myalgia and arthralgia and nausea. Currently no chest pain, shortness of breath or other cardiovascular related symptoms noted.   follow-up on  thyroid . The patient has a history of hypothyroidism for many years. It has been stable recently. Pt. denies any change in  voice, loss of hair, heat or cold intolerance. Energy level has been adequate to good. Patient denies constipation and diarrhea. No myxedema. Medication is as noted below. Verified that pt is taking it daily on an empty stomach. Well tolerated.  Numbness and timgling from shoulder to fingertips. Bilaterally. Onset 2 years ago. Increasing.   History Orion has a past medical history of Arthritis of knee, left (02/01/2012), Elevated lipoprotein(a) (12/16/2019), Hyperlipidemia, Hypertension, and Hypothyroidism.   She has a past surgical history that includes Rotator cuff repair (05/2010); Knee Arthroplasty (11/16/2011); Appendectomy; Tubal ligation; Knee Arthroplasty (02/01/2012); and Anterior cervical decomp/discectomy fusion (N/A, 01/26/2023).   Her family history includes Cancer in her father; Depression in her mother.She reports that she has never smoked. She has never used smokeless tobacco. She reports that she does not drink alcohol and does not use drugs.  Current Outpatient  Medications on File Prior to Visit  Medication Sig Dispense Refill   Multiple Vitamin (MULTIVITAMIN WITH MINERALS) TABS tablet Take 1 tablet by mouth daily.     No current facility-administered medications on file prior to visit.    ROS Review of Systems  Constitutional: Negative.   HENT: Negative.    Eyes:  Negative for visual disturbance.  Respiratory:  Negative for shortness of breath.   Cardiovascular:  Negative for chest pain.  Gastrointestinal:  Negative for abdominal pain.  Endocrine: Positive for cold intolerance.  Musculoskeletal:  Negative for arthralgias.    Objective:  BP 129/75   Pulse 60   Temp 98.2 F (36.8 C)   Ht 5' 2 (1.575 m)   Wt 175 lb (79.4 kg)   SpO2 97%   BMI 32.01 kg/m   BP Readings from Last 3 Encounters:  06/19/24 129/75  12/29/23 137/70  06/28/23 139/80    Wt Readings from Last 3 Encounters:  06/19/24 175 lb (79.4 kg)  12/29/23 172 lb (78 kg)  06/28/23 176 lb 10.1 oz (80.1 kg)     Physical Exam Constitutional:      General: She is not in acute distress.    Appearance: She is well-developed.  Cardiovascular:     Rate and Rhythm: Normal rate and regular rhythm.  Pulmonary:     Breath sounds: Normal breath sounds.  Musculoskeletal:        General: Normal range of motion.  Skin:    General: Skin is warm and dry.  Neurological:     Mental Status: She is alert and oriented to person, place, and time.       Assessment & Plan:  Neuropathy -  CBC with Differential/Platelet -     CMP14+EGFR  Hypothyroidism, unspecified type -     Synthroid ; TAKE ONE TABLET EACH MORNING BEFORE BREAKFAST  Dispense: 90 tablet; Refill: 3 -     CBC with Differential/Platelet -     CMP14+EGFR -     TSH + free T4  Essential hypertension -     Lisinopril ; TAKE ONE (1) TABLET EACH DAY  Dispense: 90 tablet; Refill: 3 -     amLODIPine  Besylate; TAKE ONE (1) TABLET EACH DAY  Dispense: 90 tablet; Refill: 3 -     CBC with Differential/Platelet -      CMP14+EGFR  Elevated lipoprotein(a) -     Pravastatin  Sodium; Take 1 tablet (80 mg total) by mouth daily.  Dispense: 90 tablet; Refill: 3 -     CBC with Differential/Platelet -     CMP14+EGFR -     Lipid panel  Other orders -     Pregabalin ; 1 qhs X7 days , then 2 qhs X 7d, then 3 qhs X 7d, then 4 qhs  Dispense: 120 capsule; Refill: 0    Allergies as of 06/19/2024       Reactions   Morphine  And Codeine Nausea And Vomiting        Medication List        Accurate as of June 19, 2024  9:04 AM. If you have any questions, ask your nurse or doctor.          amLODipine  5 MG tablet Commonly known as: NORVASC  TAKE ONE (1) TABLET EACH DAY   lisinopril  20 MG tablet Commonly known as: ZESTRIL  TAKE ONE (1) TABLET EACH DAY   multivitamin with minerals Tabs tablet Take 1 tablet by mouth daily.   pravastatin  80 MG tablet Commonly known as: PRAVACHOL  Take 1 tablet (80 mg total) by mouth daily. What changed: how much to take Changed by: Daleyza Gadomski   pregabalin  50 MG capsule Commonly known as: Lyrica  1 qhs X7 days , then 2 qhs X 7d, then 3 qhs X 7d, then 4 qhs Started by: Herb Beltre   Synthroid  125 MCG tablet Generic drug: levothyroxine  TAKE ONE TABLET EACH MORNING BEFORE BREAKFAST         Follow-up: Return in about 1 month (around 07/20/2024) for neuropathy.  Butler Der, M.D.

## 2024-06-19 NOTE — Telephone Encounter (Signed)
 Ok to switch. I see her husband.

## 2024-06-19 NOTE — Progress Notes (Signed)
DEXA shows osteopenia. I recommend weekly fosamax. ?Nurse, if pt. Is agreeable, send in Fosamax 70 mg weekly, #13. ? ?Thanks, ?WS ?

## 2024-06-20 ENCOUNTER — Other Ambulatory Visit: Payer: Self-pay

## 2024-06-20 LAB — CMP14+EGFR
ALT: 14 IU/L (ref 0–32)
AST: 24 IU/L (ref 0–40)
Albumin: 4.4 g/dL (ref 3.7–4.7)
Alkaline Phosphatase: 88 IU/L (ref 44–121)
BUN/Creatinine Ratio: 17 (ref 12–28)
BUN: 16 mg/dL (ref 8–27)
Bilirubin Total: 0.3 mg/dL (ref 0.0–1.2)
CO2: 19 mmol/L — ABNORMAL LOW (ref 20–29)
Calcium: 9.6 mg/dL (ref 8.7–10.3)
Chloride: 103 mmol/L (ref 96–106)
Creatinine, Ser: 0.93 mg/dL (ref 0.57–1.00)
Globulin, Total: 2.5 g/dL (ref 1.5–4.5)
Glucose: 95 mg/dL (ref 70–99)
Potassium: 4.8 mmol/L (ref 3.5–5.2)
Sodium: 138 mmol/L (ref 134–144)
Total Protein: 6.9 g/dL (ref 6.0–8.5)
eGFR: 61 mL/min/1.73 (ref >59)

## 2024-06-20 LAB — CBC WITH DIFFERENTIAL/PLATELET
Basophils Absolute: 0 x10E3/uL (ref 0.0–0.2)
Basos: 1 %
EOS (ABSOLUTE): 0.2 x10E3/uL (ref 0.0–0.4)
Eos: 4 %
Hematocrit: 42.3 % (ref 34.0–46.6)
Hemoglobin: 14 g/dL (ref 11.1–15.9)
Immature Grans (Abs): 0 x10E3/uL (ref 0.0–0.1)
Immature Granulocytes: 0 %
Lymphocytes Absolute: 2.2 x10E3/uL (ref 0.7–3.1)
Lymphs: 36 %
MCH: 31.6 pg (ref 26.6–33.0)
MCHC: 33.1 g/dL (ref 31.5–35.7)
MCV: 96 fL (ref 79–97)
Monocytes Absolute: 0.6 x10E3/uL (ref 0.1–0.9)
Monocytes: 9 %
Neutrophils Absolute: 3 x10E3/uL (ref 1.4–7.0)
Neutrophils: 50 %
Platelets: 362 x10E3/uL (ref 150–450)
RBC: 4.43 x10E6/uL (ref 3.77–5.28)
RDW: 12.2 % (ref 11.7–15.4)
WBC: 6.1 x10E3/uL (ref 3.4–10.8)

## 2024-06-20 LAB — LIPID PANEL
Chol/HDL Ratio: 2.8 ratio (ref 0.0–4.4)
Cholesterol, Total: 172 mg/dL (ref 100–199)
HDL: 61 mg/dL (ref >39)
LDL Chol Calc (NIH): 97 mg/dL (ref 0–99)
Triglycerides: 72 mg/dL (ref 0–149)
VLDL Cholesterol Cal: 14 mg/dL (ref 5–40)

## 2024-06-20 LAB — TSH+FREE T4
Free T4: 1.48 ng/dL (ref 0.82–1.77)
TSH: 0.944 u[IU]/mL (ref 0.450–4.500)

## 2024-06-20 MED ORDER — ALENDRONATE SODIUM 70 MG PO TABS: 70.0000 mg | ORAL_TABLET | ORAL | 13 refills | Status: DC

## 2024-06-25 ENCOUNTER — Ambulatory Visit: Payer: Self-pay | Admitting: Family Medicine

## 2024-06-25 NOTE — Progress Notes (Signed)
 Hello Kynsli,  Your lab result is normal and/or stable.Some minor variations that are not significant are commonly marked abnormal, but do not represent any medical problem for you.  Best regards, Mechele Claude, M.D.

## 2024-06-27 ENCOUNTER — Ambulatory Visit: Payer: Medicare PPO | Admitting: Family Medicine

## 2024-07-23 ENCOUNTER — Ambulatory Visit: Admitting: Family Medicine

## 2024-07-23 ENCOUNTER — Encounter: Payer: Self-pay | Admitting: Family Medicine

## 2024-07-23 VITALS — BP 147/78 | HR 75 | Temp 97.6°F | Ht 62.0 in | Wt 182.0 lb

## 2024-07-23 DIAGNOSIS — I10 Essential (primary) hypertension: Secondary | ICD-10-CM | POA: Diagnosis not present

## 2024-07-23 DIAGNOSIS — G629 Polyneuropathy, unspecified: Secondary | ICD-10-CM | POA: Diagnosis not present

## 2024-07-23 NOTE — Progress Notes (Signed)
 Subjective:  Patient ID: Chelsea Finley, female    DOB: Sep 17, 1941  Age: 83 y.o. MRN: [id number]  CC: 1 month Follow Up (Neuropathy) and Joint Swelling (Right ankle for about 1 week)   HPI  Discussed the use of AI scribe software for clinical note transcription with the patient, who gave verbal consent to proceed.  History of Present Illness   Chelsea Finley is an 83 year old female with neuropathy who presents for follow-up on her medication regimen. She is accompanied by her daughter, Chelsea Finley, who is also her private duty Engineer, civil (consulting).  Initially, she did well on pregabalin , taking one to two pills, but experienced significant side effects, including feeling disoriented and difficulty walking, when taking three pills. Consequently, she reduced her dose back to two pills, which has improved her symptoms significantly, with pain being 'a whole lot better' and more manageable.  She has concerns about her blood pressure, which is typically around 160 systolic at home, though the diastolic remains stable. She is currently taking lisinopril  and amlodipine  for hypertension.  Additionally, she has noticed swelling in her ankle, particularly at the lateral malleolus, which worsens in the evening and was initially red. The swelling has been present for about a week, and she has been managing it with elevation. She notes that the swelling is not as severe as it was initially.  [place], with her daughter Chelsea Finley living nearby.             07/23/2024   11:29 AM 06/19/2024    8:29 AM 06/28/2023    8:15 AM  Depression screen PHQ 2/9  Decreased Interest 0 0 0  Down, Depressed, Hopeless 0 0 0  PHQ - 2 Score 0 0 0  Altered sleeping  0   Tired, decreased energy  1   Change in appetite  0   Feeling bad or failure about yourself   0   Trouble concentrating  0   Moving slowly or fidgety/restless  0   Suicidal thoughts  0   PHQ-9 Score  1   Difficult  doing work/chores  Not difficult at all     History Chelsea Finley has a past medical history of Arthritis of knee, left (02/01/2012), Elevated lipoprotein(a) (12/16/2019), Hyperlipidemia, Hypertension, and Hypothyroidism.   She has a past surgical history that includes Rotator cuff repair (05/2010); Knee Arthroplasty (11/16/2011); Appendectomy; Tubal ligation; Knee Arthroplasty (02/01/2012); and Anterior cervical decomp/discectomy fusion (N/A, 01/26/2023).   Her family history includes Cancer in her father; Depression in her mother.She reports that she has never smoked. She has never used smokeless tobacco. She reports that she does not drink alcohol and does not use drugs.    ROS Review of Systems  Constitutional: Negative.   HENT: Negative.    Eyes:  Negative for visual disturbance.  Respiratory:  Negative for shortness of breath.   Cardiovascular:  Negative for chest pain.  Gastrointestinal:  Negative for abdominal pain.  Musculoskeletal:  Negative for arthralgias.    Objective:  BP (!) 147/78   Pulse 75   Temp 97.6 F (36.4 C)   Ht 5' 2 (1.575 m)   Wt 182 lb (82.6 kg)   SpO2 96%   BMI 33.29 kg/m   BP Readings from Last 3 Encounters:  07/23/24 (!) 147/78  06/19/24 129/75  12/29/23 137/70    Wt Readings from Last 3 Encounters:  07/23/24 182 lb (82.6 kg)  06/19/24 175 lb (  79.4 kg)  12/29/23 172 lb (78 kg)     Physical Exam Constitutional:      General: She is not in acute distress.    Appearance: She is well-developed.  Cardiovascular:     Rate and Rhythm: Normal rate and regular rhythm.  Pulmonary:     Breath sounds: Normal breath sounds.  Musculoskeletal:        General: Normal range of motion.  Skin:    General: Skin is warm and dry.  Neurological:     Mental Status: She is alert and oriented to person, place, and time.      Assessment & Plan:  Neuropathy  Essential hypertension    Assessment and Plan    Peripheral neuropathy Managed with  pregabalin . Two pills improved symptoms; three pills caused side effects. - Continue pregabalin  at two pills. - Prescribe 100 mg pregabalin  pills. - Consider third pill after longer induction if symptoms persist and side effects are manageable.  Right ankle swelling Swelling likely due to poor venous circulation. No evidence of blood clot. - Advise heat and elevation. - Consider compression hose or ACE wrap with proper technique.  Hypertension Managed with lisinopril  and amlodipine . Systolic around 160 mmHg. Target systolic <150 mmHg to avoid side effects. - Continue current antihypertensive regimen. - Monitor blood pressure at home; report if systolic >150 mmHg.          Follow-up: Return in about 3 months (around 10/23/2024).  Butler Der, M.D.

## 2024-08-24 ENCOUNTER — Other Ambulatory Visit: Payer: Self-pay | Admitting: Family Medicine

## 2024-08-24 NOTE — Telephone Encounter (Signed)
  Prescription Request  08/24/2024  Is this a Controlled Substance medicine? no  Have you seen your PCP in the last 2 weeks? no  If YES, route message to pool  -  If NO, patient needs to be scheduled for appointment.  What is the name of the medication or equipment? Pregabalin  50 mg capsule  Have you contacted your pharmacy to request a refill? NO   Which [facility]   Patient notified that their request is being sent to the clinical staff for review and that they should receive a response within 2 business days.

## 2024-08-28 MED ORDER — PREGABALIN 50 MG PO CAPS: ORAL_CAPSULE | ORAL | 0 refills | Status: DC

## 2024-08-29 ENCOUNTER — Telehealth: Payer: Self-pay | Admitting: Family Medicine

## 2024-08-29 NOTE — Telephone Encounter (Signed)
**  [facility] After Hours/ Emergency Line Call**  Patient: Chelsea Finley .  PCP: Zollie Lowers, MD  After hours nurse calling to inform [facility] from [facility] is questioning the Lyrica  directions.  Wants to verify refill of a taper is intended by MD.  Please contact him with information. Will forward to PCP for follow up.  Bettyjo Lundblad M. Jolinda, DO

## 2024-08-29 NOTE — Telephone Encounter (Signed)
 Was pt able to titrate to full dose of 4 pills a day?

## 2024-08-30 NOTE — Telephone Encounter (Signed)
 I called and spoke with patient about this and she confirmed that she did not take the full dose of 4 pills so she says not to worry about refilling.

## 2024-09-25 ENCOUNTER — Encounter (INDEPENDENT_AMBULATORY_CARE_PROVIDER_SITE_OTHER): Admitting: Ophthalmology

## 2024-10-23 ENCOUNTER — Ambulatory Visit: Payer: Self-pay | Admitting: Family Medicine

## 2024-10-23 ENCOUNTER — Encounter: Payer: Self-pay | Admitting: Family Medicine

## 2024-10-23 VITALS — BP 153/82 | HR 85 | Temp 98.0°F | Ht 62.0 in | Wt 177.0 lb

## 2024-10-23 DIAGNOSIS — M5431 Sciatica, right side: Secondary | ICD-10-CM | POA: Diagnosis not present

## 2024-10-23 DIAGNOSIS — M5432 Sciatica, left side: Secondary | ICD-10-CM

## 2024-10-23 MED ORDER — PREDNISONE 10 MG PO TABS: ORAL_TABLET | ORAL | 0 refills | Status: DC

## 2024-10-23 MED ORDER — PREGABALIN 75 MG PO CAPS: 75.0000 mg | ORAL_CAPSULE | Freq: Every day | ORAL | 1 refills | Status: DC

## 2024-10-23 MED ORDER — PREGABALIN 75 MG PO CAPS: 75.0000 mg | ORAL_CAPSULE | Freq: Every day | ORAL | 1 refills | Status: AC

## 2024-10-23 NOTE — Progress Notes (Signed)
 Subjective:  Patient ID: Chelsea Finley, female    DOB: [birth year]  Age: 83 y.o. MRN: [id number]  CC: Hip Pain (Hip/butt pain on both sides. Ongoing for awhile but getting worse. Feels like a bad cramp that shoots into legs. Hard to walk. Some tingling around the shin. Hurts a lot when she sits on something hard, needs a cushion. )   HPI  Discussed the use of AI scribe software for clinical note transcription with the patient, who gave verbal consent to proceed.  History of Present Illness SHARDAI Finley is an 83 year old female who presents with cramping pain affecting her walking.  She experiences cramping pain that affects her ability to walk, described as a cramp occurring when sitting down, particularly on the commode, and when trying to stand up straight. This has been ongoing for quite a while, and she holds onto the hallway for support when walking into the den.  She recalls being prescribed a medication, possibly pregabalin , which she took once a day, four times a week, and then increased to twice a day. However, taking two made her pass out, so she stopped taking it.  She has a history of numbness and tingling in her shoulder, which was previously addressed with medication. She mentions having had surgery on one shoulder.  No recent falls or injuries that could have caused the pain.          07/23/2024   11:29 AM 06/19/2024    8:29 AM 06/28/2023    8:15 AM  Depression screen PHQ 2/9  Decreased Interest 0 0 0  Down, Depressed, Hopeless 0 0 0  PHQ - 2 Score 0 0 0  Altered sleeping  0   Tired, decreased energy  1   Change in appetite  0   Feeling bad or failure about yourself   0   Trouble concentrating  0   Moving slowly or fidgety/restless  0   Suicidal thoughts  0   PHQ-9 Score  1    Difficult doing work/chores  Not difficult at all      Data saved with a previous flowsheet row definition    History Yamilex has a past medical history of Arthritis of knee, left  (02/01/2012), Elevated lipoprotein(a) (12/16/2019), Hyperlipidemia, Hypertension, and Hypothyroidism.   She has a past surgical history that includes Rotator cuff repair (05/2010); Knee Arthroplasty (11/16/2011); Appendectomy; Tubal ligation; Knee Arthroplasty (02/01/2012); and Anterior cervical decomp/discectomy fusion (N/A, 01/26/2023).   Her family history includes Cancer in her father; Depression in her mother.She reports that she has never smoked. She has never used smokeless tobacco. She reports that she does not drink alcohol and does not use drugs.    ROS Review of Systems  Objective:  BP (!) 153/82   Pulse 85   Temp 98 F (36.7 C)   Ht 5' 2 (1.575 m)   Wt 177 lb (80.3 kg)   SpO2 95%   BMI 32.37 kg/m   BP Readings from Last 3 Encounters:  10/23/24 (!) 153/82  07/23/24 (!) 147/78  06/19/24 129/75    Wt Readings from Last 3 Encounters:  10/23/24 177 lb (80.3 kg)  07/23/24 182 lb (82.6 kg)  06/19/24 175 lb (79.4 kg)     Physical Exam Physical Exam GENERAL: Alert, cooperative, well developed, no acute distress HEENT: Normocephalic CARDIOVASCULAR: Normal heart rate and rhythm, S1 and S2 normal without murmurs ABDOMEN: Soft, non-tender, non-distended, without organomegaly, Normal bowel sounds EXTREMITIES: No cyanosis or edema.  Tender at sciatic notch bilaterally NEUROLOGICAL: Cranial nerves grossly intact, Moves all extremities without gross motor or sensory deficit   Assessment & Plan:  Bilateral sciatica  Other orders -     predniSONE ; Take 5 daily for 2 days followed by 4,3,2 and 1 for 2 days each.  Dispense: 30 tablet; Refill: 0 -     Pregabalin ; Take 1 capsule (75 mg total) by mouth at bedtime.  Dispense: 90 capsule; Refill: 1    Assessment and Plan Assessment & Plan Peripheral neuropathy   Chronic peripheral neuropathy presents with cramp-like pain affecting ambulation, likely involving the sciatic nerve. Symptoms worsen with sitting and improve with  standing. Pregabalin  was previously effective but caused drowsiness at higher doses. No recent injury or fall reported, and an X-ray is not indicated due to nerve involvement. Prescribe prednisone  pills for 10 days, taken in the morning. Prescribe pregabalin  75 mg in the evening for chronic relief.       Follow-up: No follow-ups on file.  Butler Der, M.D.

## 2024-11-23 ENCOUNTER — Ambulatory Visit: Payer: Self-pay | Admitting: Family Medicine

## 2024-11-23 NOTE — Telephone Encounter (Signed)
 FYI Only or Action Required?: Action required by provider: Requesting steroid.  Patient was last seen in primary care on 10/23/2024 by Chelsea Lowers, MD.  Called Nurse Triage reporting Hip Pain.  Symptoms began several days ago.  Interventions attempted: OTC medications: Tylenol , aleve.  Symptoms are: unchanged.  Triage Disposition: See PCP When Office is Open (Within 3 Days)  Patient/caregiver understands and will follow disposition?: Yes Reason for Disposition  [1] MODERATE pain (e.g., interferes with normal activities, limping) AND [2] present > 3 days  Answer Assessment - Initial Assessment Questions Patient's daughter Chelsea Finley calling in with patient today. Tylenol  / Aleve. Reports mild relief sometimes. Patient denies getting imaging done on this hip, advised UC for imaging and evaluation this weekend, patient wanting to schedule for Monday. appointment set for 12/22. Patient daughter requesting a steroid sent into pharmacy on file. Requesting call back [phone]  1. LOCATION and RADIATION: Where is the pain located? Does the pain spread (shoot) anywhere else?     Right hip, denies radiation  2. QUALITY: What does the pain feel like?  (e.g., sharp, dull, aching, burning)     Just hurts  3. SEVERITY: How bad is the pain? What does it keep you from doing?   (Scale 1-10; or mild, moderate, severe)     10/10, can hardly walk  4. ONSET: When did the pain start? Does it come and go, or is it there all the time?     A while seen PCP about 1 month ago, worsened last few days  5. WORK OR EXERCISE: Has there been any recent work or exercise that involved this part of the body?      Denies  6. CAUSE: What do you think is causing the hip pain?      Unsure, PCP thought it was nerve pain  7. AGGRAVATING FACTORS: What makes the hip pain worse? (e.g., walking, climbing stairs, running)     Movement, pressure  8. OTHER SYMPTOMS: Do you have any other  symptoms? (e.g., back pain, pain shooting down leg,  fever, rash)     Denies  Protocols used: Hip Pain-A-AH Copied from CRM #[id number]. Topic: Clinical - Red Word Triage >> Nov 23, 2024 12:52 PM Antwanette L wrote: Red Word that prompted transfer to Nurse Triage: Chelsea Finley, the patient daughter , is calling to report the patient is having severe right-sided(hip) pain.

## 2024-11-23 NOTE — Telephone Encounter (Signed)
Appt made for Monday. °

## 2024-11-26 ENCOUNTER — Ambulatory Visit: Admitting: Nurse Practitioner

## 2024-11-26 ENCOUNTER — Encounter: Payer: Self-pay | Admitting: Nurse Practitioner

## 2024-11-26 VITALS — BP 149/77 | HR 73 | Temp 97.6°F | Ht 62.0 in | Wt 177.0 lb

## 2024-11-26 DIAGNOSIS — M7918 Myalgia, other site: Secondary | ICD-10-CM | POA: Diagnosis not present

## 2024-11-26 DIAGNOSIS — M5431 Sciatica, right side: Secondary | ICD-10-CM | POA: Insufficient documentation

## 2024-11-26 DIAGNOSIS — M5432 Sciatica, left side: Secondary | ICD-10-CM | POA: Diagnosis not present

## 2024-11-26 MED ORDER — DICLOFENAC SODIUM 1 % EX GEL: 2.0000 g | Freq: Four times a day (QID) | CUTANEOUS | 1 refills | Status: AC

## 2024-11-26 NOTE — Progress Notes (Signed)
 "    Subjective:  Patient ID: Earnie CINDERELLA Bars, female    DOB: [birth year], 83 y.o.   MRN: [id number]  Patient Care Team: Zollie Lowers, MD as PCP - General (Family Medicine)   Chief Complaint:  Hip Pain (Hip pain both sides for a while but getting worse )   HPI: THEADORA NOYES is a 83 y.o. female presenting on 11/26/2024 for Hip Pain (Hip pain both sides for a while but getting worse )   Discussed the use of AI scribe software for clinical note transcription with the patient, who gave verbal consent to proceed.  History of Present Illness LAQUASHIA MERGENTHALER is an 83 year old female who presents with worsening tailbone pain.  She experiences pain localized to the bottom of her tailbone, which worsens when she stands up and is most intense when sitting down. The pain has progressively worsened over the past few days, making it difficult for her to stand and perform daily activities such as housework.  She previously saw her PCP on October 23, 2024, for buttock and hip pain, at which time she was prescribed steroids and Lyrica . Initially, these treatments were helpful, but now she reports that nothing helps and the pain has worsened. She has been using a heating pad for relief, but the pain persists.  She uses a recliner, which is soft, to sit on, and her husband assists her with some tasks due to her pain.  Her blood pressure readings have been consistently high, despite taking her medication.    Relevant past medical, surgical, family, and social history reviewed and updated as indicated.  Allergies and medications reviewed and updated. Data reviewed: Chart in Epic.   Past Medical History:  Diagnosis Date   Arthritis of knee, left 02/01/2012   Elevated lipoprotein(a) 12/16/2019   Hyperlipidemia    Hypertension    Hypothyroidism     Past Surgical History:  Procedure Laterality Date   ANTERIOR CERVICAL DECOMP/DISCECTOMY FUSION N/A 01/26/2023   Procedure: ANTERIOR CERVICAL  DECOMPRESSION/DISECTOMY FUSION CERVICAL THREE-CERVICAL FOUR - CERVICAL FOUR-CERIVACL FIVE;  Surgeon: Onetha Kuba, MD;  Location: [facility] OR;  Service: Neurosurgery;  Laterality: N/A;  3C   APPENDECTOMY     KNEE ARTHROPLASTY  11/16/2011   Procedure: COMPUTER ASSISTED TOTAL KNEE ARTHROPLASTY;  Surgeon: Lonni CINDERELLA Poli;  Location: [facility] OR;  Service: Orthopedics;  Laterality: Right;  Right total knee arthroplasty   KNEE ARTHROPLASTY  02/01/2012   Procedure: COMPUTER ASSISTED TOTAL KNEE ARTHROPLASTY;  Surgeon: Lonni CINDERELLA Poli, MD;  Location: [facility] OR;  Service: Orthopedics;  Laterality: Left;  Left total knee arthroplasty   ROTATOR CUFF REPAIR  05/2010   TUBAL LIGATION      Social History   Socioeconomic History   Marital status: Married    Spouse name: Beryl Dade    Number of children: 4   Years of education: 12   Highest education level: 12th grade  Occupational History   Occupation: Retired   Tobacco Use   Smoking status: Never   Smokeless tobacco: Never  Vaping Use   Vaping status: Never Used  Substance and Sexual Activity   Alcohol use: No   Drug use: No   Sexual activity: Not Currently  Other Topics Concern   Not on file  Social History Narrative   Right handed   Drinks coffee in am   Wears Rx glasses    Social Drivers of Health   Tobacco Use: Low Risk (11/26/2024)   Patient History  Smoking Tobacco Use: Never    Smokeless Tobacco Use: Never    Passive Exposure: Not on file  Financial Resource Strain: Not on file  Food Insecurity: Not on file  Transportation Needs: Not on file  Physical Activity: Not on file  Stress: Not on file  Social Connections: Not on file  Intimate Partner Violence: Not on file  Depression (PHQ2-9): Low Risk (07/23/2024)   Depression (PHQ2-9)    PHQ-2 Score: 0  Alcohol Screen: Not on file  Housing: Not on file  Utilities: Not on file  Health Literacy: Not on file    Outpatient Encounter Medications as of 11/26/2024  Medication Sig    alendronate  (FOSAMAX ) 70 MG tablet Take 1 tablet (70 mg total) by mouth every 7 (seven) days. Take with a full glass of water on an empty stomach.   amLODipine  (NORVASC ) 5 MG tablet TAKE ONE (1) TABLET EACH DAY   diclofenac  Sodium (VOLTAREN ) 1 % GEL Apply 2 g topically 4 (four) times daily.   lisinopril  (ZESTRIL ) 20 MG tablet TAKE ONE (1) TABLET EACH DAY   Multiple Vitamin (MULTIVITAMIN WITH MINERALS) TABS tablet Take 1 tablet by mouth daily.   pravastatin  (PRAVACHOL ) 80 MG tablet Take 1 tablet (80 mg total) by mouth daily.   pregabalin  (LYRICA ) 75 MG capsule Take 1 capsule (75 mg total) by mouth at bedtime.   SYNTHROID  125 MCG tablet TAKE ONE TABLET EACH MORNING BEFORE BREAKFAST   [DISCONTINUED] predniSONE  (DELTASONE ) 10 MG tablet Take 5 daily for 2 days followed by 4,3,2 and 1 for 2 days each. (Patient not taking: Reported on 11/26/2024)   No facility-administered encounter medications on file as of 11/26/2024.    Allergies[1]  Pertinent ROS per HPI, otherwise unremarkable      Objective:  BP (!) 149/77   Pulse 73   Temp 97.6 F (36.4 C) (Temporal)   Ht 5' 2 (1.575 m)   Wt 177 lb (80.3 kg) Comment: used last weight in chart pt unable to get weight today  SpO2 97%   BMI 32.37 kg/m    Wt Readings from Last 3 Encounters:  11/26/24 177 lb (80.3 kg)  10/23/24 177 lb (80.3 kg)  07/23/24 182 lb (82.6 kg)   BP Readings from Last 3 Encounters:  11/26/24 (!) 149/77  10/23/24 (!) 153/82  07/23/24 (!) 147/78    Physical Exam Vitals and nursing note reviewed.  Constitutional:      Appearance: She is obese.  HENT:     Head: Normocephalic and atraumatic.     Nose: Nose normal.     Mouth/Throat:     Mouth: Mucous membranes are moist.  Eyes:     Extraocular Movements: Extraocular movements intact.     Conjunctiva/sclera: Conjunctivae normal.     Pupils: Pupils are equal, round, and reactive to light.  Cardiovascular:     Heart sounds: Normal heart sounds.  Pulmonary:      Effort: Pulmonary effort is normal.     Breath sounds: Normal breath sounds.  Musculoskeletal:        General: Tenderness present.     Right lower leg: No edema.     Left lower leg: No edema.     Comments: At the sacroiliac junction  Skin:    General: Skin is warm and dry.  Neurological:     Mental Status: She is alert and oriented to person, place, and time.     Comments: Use a walker for ambulation  Psychiatric:  Mood and Affect: Mood normal.        Behavior: Behavior normal.        Thought Content: Thought content normal.        Judgment: Judgment normal.    Physical Exam VITALS: BP- 149/78 MUSCULOSKELETAL: Hips non-tender to palpation.     Results for orders placed or performed in visit on 06/19/24  CBC with Differential/Platelet   Collection Time: 06/19/24  9:10 AM  Result Value Ref Range   WBC 6.1 3.4 - 10.8 x10E3/uL   RBC 4.43 3.77 - 5.28 x10E6/uL   Hemoglobin 14.0 11.1 - 15.9 g/dL   Hematocrit 57.6 65.9 - 46.6 %   MCV 96 79 - 97 fL   MCH 31.6 26.6 - 33.0 pg   MCHC 33.1 31.5 - 35.7 g/dL   RDW 87.7 88.2 - 84.5 %   Platelets 362 150 - 450 x10E3/uL   Neutrophils 50 Not Estab. %   Lymphs 36 Not Estab. %   Monocytes 9 Not Estab. %   Eos 4 Not Estab. %   Basos 1 Not Estab. %   Neutrophils Absolute 3.0 1.4 - 7.0 x10E3/uL   Lymphocytes Absolute 2.2 0.7 - 3.1 x10E3/uL   Monocytes Absolute 0.6 0.1 - 0.9 x10E3/uL   EOS (ABSOLUTE) 0.2 0.0 - 0.4 x10E3/uL   Basophils Absolute 0.0 0.0 - 0.2 x10E3/uL   Immature Granulocytes 0 Not Estab. %   Immature Grans (Abs) 0.0 0.0 - 0.1 x10E3/uL  CMP14+EGFR   Collection Time: 06/19/24  9:10 AM  Result Value Ref Range   Glucose 95 70 - 99 mg/dL   BUN 16 8 - 27 mg/dL   Creatinine, Ser 9.06 0.57 - 1.00 mg/dL   eGFR 61 >40 fO/fpw/8.26   BUN/Creatinine Ratio 17 12 - 28   Sodium 138 134 - 144 mmol/L   Potassium 4.8 3.5 - 5.2 mmol/L   Chloride 103 96 - 106 mmol/L   CO2 19 (L) 20 - 29 mmol/L   Calcium  9.6 8.7 - 10.3 mg/dL    Total Protein 6.9 6.0 - 8.5 g/dL   Albumin 4.4 3.7 - 4.7 g/dL   Globulin, Total 2.5 1.5 - 4.5 g/dL   Bilirubin Total 0.3 0.0 - 1.2 mg/dL   Alkaline Phosphatase 88 44 - 121 IU/L   AST 24 0 - 40 IU/L   ALT 14 0 - 32 IU/L  Lipid panel   Collection Time: 06/19/24  9:10 AM  Result Value Ref Range   Cholesterol, Total 172 100 - 199 mg/dL   Triglycerides 72 0 - 149 mg/dL   HDL 61 >60 mg/dL   VLDL Cholesterol Cal 14 5 - 40 mg/dL   LDL Chol Calc (NIH) 97 0 - 99 mg/dL   Chol/HDL Ratio 2.8 0.0 - 4.4 ratio  TSH + free T4   Collection Time: 06/19/24  9:10 AM  Result Value Ref Range   TSH 0.944 0.450 - 4.500 uIU/mL   Free T4 1.48 0.82 - 1.77 ng/dL       Pertinent labs & imaging results that were available during my care of the patient were reviewed by me and considered in my medical decision making.  Assessment & Plan:  Quenna was seen today for hip pain.  Diagnoses and all orders for this visit:  Bilateral sciatica -     diclofenac  Sodium (VOLTAREN ) 1 % GEL; Apply 2 g topically 4 (four) times daily.  Bilateral buttock pain -     diclofenac  Sodium (VOLTAREN ) 1 % GEL; Apply  2 g topically 4 (four) times daily.     Assessment and Plan Assessment & Plan Sacrococcygeal pain Chronic pain likely muscular in origin, no fracture or hip involvement. - Prescribed Voltaren  gel four times daily. - Advised to avoid housework.  Hypertension Blood pressure consistently elevated, systolic above goal. - Advised to take medication as prescribed. - Discussed reducing dietary salt intake.  Osteoporosis - Continue Fosamax  75 mg as prescribed.      Continue all other maintenance medications.  Follow up plan: Return if symptoms worsen or fail to improve.   Continue healthy lifestyle choices, including diet (rich in fruits, vegetables, and lean proteins, and low in salt and simple carbohydrates) and exercise (at least 30 minutes of moderate physical activity daily).  Educational handout  given for   Sciatica  Sciatica is pain, weakness, tingling, or loss of feeling (numbness) along the sciatic nerve. The sciatic nerve starts in the lower back and goes down the back of each leg. Sciatica usually affects one side of the body. Sciatica usually goes away on its own or with treatment. Sometimes, sciatica may come back. What are the causes? This condition happens when the sciatic nerve is pinched or has pressure put on it. This may be caused by: A disk in between the bones of the spine bulging out too far (herniated disk). Changes in the spinal disks due to aging. A condition that affects a muscle in the butt. Extra bone growth near the sciatic nerve. A break (fracture) of the area between your hip bones (pelvis). Pregnancy. Tumor. This is rare. What increases the risk? You are more likely to develop this condition if you: Play sports that put pressure or stress on the spine. Have poor strength and ease of movement (flexibility). Have had a back injury or back surgery. Sit for long periods of time. Do activities that involve bending or lifting over and over again. Are very overweight (obese). What are the signs or symptoms? Symptoms can vary from mild to very bad. They may include: Any of these problems in the lower back, leg, hip, or butt: Mild tingling, loss of feeling, or dull aches. A burning feeling. Sharp pains. Loss of feeling in the back of the calf or the sole of the foot. Leg weakness. Very bad back pain that makes it hard to move. These symptoms may get worse when you cough, sneeze, or laugh. They may also get worse when you sit or stand for long periods of time. How is this treated? This condition often gets better without any treatment. However, treatment may include: Changing or cutting back on physical activity when you have pain. Exercising, including strengthening and stretching. Putting ice or heat on the affected area. Shots of medicines to relieve  pain and swelling or to relax your muscles. Surgery. Follow these instructions at home: Medicines Take over-the-counter and prescription medicines only as told by your doctor. Ask your doctor if you should avoid driving or using machines while you are taking your medicine. Managing pain     If told, put ice on the affected area. To do this: Put ice in a plastic bag. Place a towel between your skin and the bag. Leave the ice on for 20 minutes, 2-3 times a day. If your skin turns bright red, take off the ice right away to prevent skin damage. The risk of skin damage is higher if you cannot feel pain, heat, or cold. If told, put heat on the affected area. Do this as  often as told by your doctor. Use the heat source that your doctor tells you to use, such as a moist heat pack or a heating pad. Place a towel between your skin and the heat source. Leave the heat on for 20-30 minutes. If your skin turns bright red, take off the heat right away to prevent burns. The risk of burns is higher if you cannot feel pain, heat, or cold. Activity  Return to your normal activities when your doctor says that it is safe. Avoid activities that make your symptoms worse. Take short rests during the day. When you rest for a long time, do some physical activity or stretching between periods of rest. Avoid sitting for a long time without moving. Get up and move around at least one time each hour. Do exercises and stretches as told by your doctor. Do not lift anything that is heavier than 10 lb (4.5 kg). Avoid lifting heavy things even when you do not have symptoms. Avoid lifting heavy things over and over. When you lift objects, always lift in a way that is safe for your body. To do this, you should: Bend your knees. Keep the object close to your body. Avoid twisting. General instructions Stay at a healthy weight. Wear comfortable shoes that support your feet. Avoid wearing high heels. Avoid sleeping on a  mattress that is too soft or too hard. You might have less pain if you sleep on a mattress that is firm enough to support your back. Contact a doctor if: Your pain is not controlled by medicine. Your pain does not get better. Your pain gets worse. Your pain lasts longer than 4 weeks. You lose weight without trying. Get help right away if: You cannot control when you pee (urinate) or poop (have a bowel movement). You have weakness in any of these areas and it gets worse: Lower back. The area between your hip bones. Butt. Legs. You have redness or swelling of your back. You have a burning feeling when you pee. Summary Sciatica is pain, weakness, tingling, or loss of feeling (numbness) along the sciatic nerve. This may include the lower back, legs, hips, and butt. This condition happens when the sciatic nerve is pinched or has pressure put on it. Treatment often includes rest, exercise, medicines, and putting ice or heat on the affected area. This information is not intended to replace advice given to you by your health care provider. Make sure you discuss any questions you have with your health care provider. Document Revised: 03/01/2022 Document Reviewed: 03/01/2022 Elsevier Patient Education  2024 [employer]    The above assessment and management plan was discussed with the patient. The patient verbalized understanding of and has agreed to the management plan. Patient is aware to call the clinic if they develop any new symptoms or if symptoms persist or worsen. Patient is aware when to return to the clinic for a follow-up visit. Patient educated on when it is appropriate to go to the emergency department.    Rush Salce [place] Thompson, DNP [facility] [address] [place], [place] [address] [phone]      [1]  Allergies Allergen Reactions   Morphine  And Codeine Nausea And Vomiting   "

## 2024-11-27 ENCOUNTER — Emergency Department (HOSPITAL_COMMUNITY)
Admission: EM | Admit: 2024-11-27 | Discharge: 2024-11-27 | Disposition: A | Discharge status: Home / Self Care | Attending: Emergency Medicine | Admitting: Emergency Medicine

## 2024-11-27 ENCOUNTER — Emergency Department (HOSPITAL_COMMUNITY)

## 2024-11-27 ENCOUNTER — Encounter (HOSPITAL_COMMUNITY): Payer: Self-pay

## 2024-11-27 ENCOUNTER — Other Ambulatory Visit: Payer: Self-pay

## 2024-11-27 DIAGNOSIS — Z96652 Presence of left artificial knee joint: Secondary | ICD-10-CM | POA: Insufficient documentation

## 2024-11-27 DIAGNOSIS — M5442 Lumbago with sciatica, left side: Secondary | ICD-10-CM | POA: Insufficient documentation

## 2024-11-27 DIAGNOSIS — M5441 Lumbago with sciatica, right side: Secondary | ICD-10-CM | POA: Insufficient documentation

## 2024-11-27 DIAGNOSIS — M545 Low back pain, unspecified: Secondary | ICD-10-CM | POA: Diagnosis present

## 2024-11-27 DIAGNOSIS — E039 Hypothyroidism, unspecified: Secondary | ICD-10-CM | POA: Insufficient documentation

## 2024-11-27 DIAGNOSIS — M5432 Sciatica, left side: Secondary | ICD-10-CM

## 2024-11-27 DIAGNOSIS — I10 Essential (primary) hypertension: Secondary | ICD-10-CM | POA: Insufficient documentation

## 2024-11-27 DIAGNOSIS — Z79899 Other long term (current) drug therapy: Secondary | ICD-10-CM | POA: Diagnosis not present

## 2024-11-27 HISTORY — DX: Age-related osteoporosis without current pathological fracture: M81.0

## 2024-11-27 MED ORDER — OXYCODONE HCL 5 MG PO TABS
5.0000 mg | ORAL_TABLET | Freq: Once | ORAL | Status: AC
  Administered 2024-11-27: 5 mg via ORAL
  Filled 2024-11-27: qty 1

## 2024-11-27 MED ORDER — PREDNISONE 10 MG PO TABS: 40.0000 mg | ORAL_TABLET | Freq: Every day | ORAL | 0 refills | Status: DC

## 2024-11-27 MED ORDER — PREDNISONE 50 MG PO TABS
60.0000 mg | ORAL_TABLET | Freq: Once | ORAL | Status: AC
  Administered 2024-11-27: 60 mg via ORAL
  Filled 2024-11-27: qty 1

## 2024-11-27 MED ORDER — METHOCARBAMOL 500 MG PO TABS: 500.0000 mg | ORAL_TABLET | Freq: Two times a day (BID) | ORAL | 0 refills | Status: AC

## 2024-11-27 MED ORDER — MELOXICAM 7.5 MG PO TABS: 7.5000 mg | ORAL_TABLET | Freq: Every day | ORAL | 0 refills | Status: AC

## 2024-11-27 MED ORDER — KETOROLAC TROMETHAMINE 15 MG/ML IJ SOLN
15.0000 mg | Freq: Once | INTRAMUSCULAR | Status: AC
  Administered 2024-11-27: 15 mg via INTRAMUSCULAR
  Filled 2024-11-27: qty 1

## 2024-11-27 NOTE — ED Notes (Signed)
 Ambulation attempted, pt was able to sit up on the side on bed with minimal pain, but when she tried to stand up to walk she felt pain and wanted to sit back down.

## 2024-11-27 NOTE — ED Notes (Signed)
 Patient transported to CT

## 2024-11-27 NOTE — Discharge Instructions (Addendum)
 You were evaluated in the emergency room for lower back pain.  This was most consistent with sciatica.  Your CT scan did show some multilevel degenerative changes without any acute abnormality.  A prescription for prednisone  was sent to your pharmacy.  Please take as prescribed.  Robaxin , muscle relaxer was additionally sent to your pharmacy.  Please avoid driving or drinking alcohol while using this medication as you may experience drowsiness.  You were additionally prescribed meloxicam , please discontinue any other NSAIDs including the diclofenac  sent in previously.  You are provided a referral for orthopedics.  Please call make an appointment at your earliest convenience.  Please contact your PCP tomorrow to help assist home PT. In the interim please see the attached sciatica rehab exercises.

## 2024-11-27 NOTE — ED Triage Notes (Signed)
 Pt arrived via POV from home due to recurrent bilateral lower back pain. Pt reports none of the prescribed medications from her PCP office are helping. Pt denies any injury. Pt unable to stand w/o assistance and required assistance getting in a wheel chair from her vehicle.

## 2024-11-27 NOTE — ED Provider Notes (Signed)
 " [place] EMERGENCY DEPARTMENT AT [facility] Provider Note   CSN: [id number] Arrival date & time: 11/27/24  1553     Patient presents with: Back Pain   Chelsea Finley is a 83 y.o. female history of hypertension, hyperlipidemia presents with complaints of bilateral buttock pain that radiates down her legs.  Pain has been intermittent for over the past month.  Denies any injury or trauma.  States that she was initially seen beginning of November was treated with a short course of steroids.  The provided significant relief.  Was evaluated by her PCP yesterday and provided Voltaren  gel without any notable improvement.  Denies any urinary or fecal incontinence.  No fevers or history of malignancy.  Does report that she has been constipated over the past couple days.  Still passing flatus.  No vomiting or abdominal pain.  Does note prior abdominal surgical history.    Back Pain  Past Medical History:  Diagnosis Date   Arthritis of knee, left 02/01/2012   Elevated lipoprotein(a) 12/16/2019   Hyperlipidemia    Hypertension    Hypothyroidism    Osteoporosis    Past Surgical History:  Procedure Laterality Date   ANTERIOR CERVICAL DECOMP/DISCECTOMY FUSION N/A 01/26/2023   Procedure: ANTERIOR CERVICAL DECOMPRESSION/DISECTOMY FUSION CERVICAL THREE-CERVICAL FOUR - CERVICAL FOUR-CERIVACL FIVE;  Surgeon: Onetha Kuba, MD;  Location: [facility] OR;  Service: Neurosurgery;  Laterality: N/A;  3C   APPENDECTOMY     KNEE ARTHROPLASTY  11/16/2011   Procedure: COMPUTER ASSISTED TOTAL KNEE ARTHROPLASTY;  Surgeon: Lonni CINDERELLA Poli;  Location: [facility] OR;  Service: Orthopedics;  Laterality: Right;  Right total knee arthroplasty   KNEE ARTHROPLASTY  02/01/2012   Procedure: COMPUTER ASSISTED TOTAL KNEE ARTHROPLASTY;  Surgeon: Lonni CINDERELLA Poli, MD;  Location: [facility] OR;  Service: Orthopedics;  Laterality: Left;  Left total knee arthroplasty   ROTATOR CUFF REPAIR  05/2010   TUBAL LIGATION        Past  Medical History:  Diagnosis Date   Arthritis of knee, left 02/01/2012   Elevated lipoprotein(a) 12/16/2019   Hyperlipidemia    Hypertension    Hypothyroidism    Osteoporosis    Past Surgical History:  Procedure Laterality Date   ANTERIOR CERVICAL DECOMP/DISCECTOMY FUSION N/A 01/26/2023   Procedure: ANTERIOR CERVICAL DECOMPRESSION/DISECTOMY FUSION CERVICAL THREE-CERVICAL FOUR - CERVICAL FOUR-CERIVACL FIVE;  Surgeon: Onetha Kuba, MD;  Location: [facility] OR;  Service: Neurosurgery;  Laterality: N/A;  3C   APPENDECTOMY     KNEE ARTHROPLASTY  11/16/2011   Procedure: COMPUTER ASSISTED TOTAL KNEE ARTHROPLASTY;  Surgeon: Lonni CINDERELLA Poli;  Location: [facility] OR;  Service: Orthopedics;  Laterality: Right;  Right total knee arthroplasty   KNEE ARTHROPLASTY  02/01/2012   Procedure: COMPUTER ASSISTED TOTAL KNEE ARTHROPLASTY;  Surgeon: Lonni CINDERELLA Poli, MD;  Location: [facility] OR;  Service: Orthopedics;  Laterality: Left;  Left total knee arthroplasty   ROTATOR CUFF REPAIR  05/2010   TUBAL LIGATION       Prior to Admission medications  Medication Sig Start Date End Date Taking? Authorizing Provider  meloxicam  (MOBIC ) 7.5 MG tablet Take 1 tablet (7.5 mg total) by mouth daily. 11/27/24  Yes Donnajean Lynwood DEL, PA-C  methocarbamol  (ROBAXIN ) 500 MG tablet Take 1 tablet (500 mg total) by mouth 2 (two) times daily. 11/27/24  Yes Donnajean Lynwood DEL, PA-C  predniSONE  (DELTASONE ) 10 MG tablet Take 4 tablets (40 mg total) by mouth daily. 11/27/24  Yes Donnajean Lynwood DEL, PA-C  alendronate  (FOSAMAX ) 70 MG tablet Take 1  tablet (70 mg total) by mouth every 7 (seven) days. Take with a full glass of water on an empty stomach. 06/20/24   Zollie Lowers, MD  amLODipine  (NORVASC ) 5 MG tablet TAKE ONE (1) TABLET EACH DAY 06/19/24   Zollie Lowers, MD  diclofenac  Sodium (VOLTAREN ) 1 % GEL Apply 2 g topically 4 (four) times daily. 11/26/24   [facility] Pool, NP  lisinopril  (ZESTRIL ) 20 MG tablet TAKE ONE (1) TABLET EACH  DAY 06/19/24   Zollie Lowers, MD  Multiple Vitamin (MULTIVITAMIN WITH MINERALS) TABS tablet Take 1 tablet by mouth daily.    [provider]  pravastatin  (PRAVACHOL ) 80 MG tablet Take 1 tablet (80 mg total) by mouth daily. 06/19/24   Zollie Lowers, MD  pregabalin  (LYRICA ) 75 MG capsule Take 1 capsule (75 mg total) by mouth at bedtime. 10/23/24   Zollie Lowers, MD  SYNTHROID  125 MCG tablet TAKE ONE TABLET EACH MORNING BEFORE BREAKFAST 06/19/24   Zollie Lowers, MD    Allergies: Morphine  and codeine    Review of Systems  Musculoskeletal:  Positive for back pain.    Updated Vital Signs BP (!) 148/111   Pulse 80   Temp 98.3 F (36.8 C) (Oral)   Resp 19   Ht 5' 2 (1.575 m)   Wt 80.3 kg   SpO2 90%   BMI 32.37 kg/m   Physical Exam Vitals and nursing note reviewed.  Constitutional:      General: She is not in acute distress.    Appearance: She is well-developed.  HENT:     Head: Normocephalic and atraumatic.  Eyes:     Conjunctiva/sclera: Conjunctivae normal.  Cardiovascular:     Rate and Rhythm: Normal rate and regular rhythm.     Heart sounds: No murmur heard. Pulmonary:     Effort: Pulmonary effort is normal. No respiratory distress.     Breath sounds: Normal breath sounds.  Abdominal:     Palpations: Abdomen is soft.     Tenderness: There is no abdominal tenderness.  Musculoskeletal:        General: No swelling.     Cervical back: Neck supple.     Comments: Tender over bilateral buttock region without any midline spinal tenderness, positive left straight leg raise, 5 out of 5 lower extremity strength  Skin:    General: Skin is warm and dry.     Capillary Refill: Capillary refill takes less than 2 seconds.  Neurological:     Mental Status: She is alert.  Psychiatric:        Mood and Affect: Mood normal.     (all labs ordered are listed, but only abnormal results are displayed) Labs Reviewed - No data to display  EKG: None  Radiology: CT Lumbar  Spine Wo Contrast Result Date: 11/27/2024 EXAM: CT OF THE LUMBAR SPINE WITHOUT CONTRAST 11/27/2024 06:38:16 PM TECHNIQUE: CT of the lumbar spine was performed without the administration of intravenous contrast. Multiplanar reformatted images are provided for review. Automated exposure control, iterative reconstruction, and/or weight based adjustment of the mA/kV was utilized to reduce the radiation dose to as low as reasonably achievable. COMPARISON: None available. CLINICAL HISTORY: Low back pain, symptoms persist with > 6 wks treatment; Lumbar radiculopathy, symptoms persist with > 6 wks treatment. FINDINGS: BONES AND ALIGNMENT: Normal vertebral body heights. 4 mm of retrolisthesis of L2 is favor chronic. Nondisplaced chronic fracture of the right L5 pars interarticularis. No spondylolisthesis of L5. Overall alignment is otherwise maintained. DEGENERATIVE CHANGES: Multilevel spondylosis,  disc space height loss, degenerative endplate changes rate is at L5-S1 where it is moderate. Vacuum phenomenon in the disc spaces at L3-L4, L4-L5, and L5-S1. Moderate facet arthropathy at L3-L4, L4-L5 and advanced facet arthropathy at L5-S1. Posterior disc bulge in combination with ligamentum flavum hypertrophy and facet arthropathy cause moderate spinal canal narrowing at L2-L3 and L4-L5. Severe spinal canal narrowing at L3-L4. Severe left and moderate-to-severe right neural foraminal narrowing at L2-L3 and L3-L4. SOFT TISSUES: No acute abnormality. IMPRESSION: 1. Severe spinal canal narrowing at L3-L4. 2. Severe left and moderate-to-severe right neural foraminal narrowing at L2-L3 and L3-L4. 3. Moderate spinal canal narrowing at L2-L3 and L4-L5. 4. Nondisplaced chronic fracture of the right L5 pars interarticularis. No spondylolisthesis of L5. Electronically signed by: Norman Gatlin MD 11/27/2024 08:01 PM EST RP Workstation: [id number]     Procedures   Medications Ordered in the ED  ketorolac  (TORADOL ) 15 MG/ML  injection 15 mg (15 mg Intramuscular Given 11/27/24 1711)  predniSONE  (DELTASONE ) tablet 60 mg (60 mg Oral Given 11/27/24 1711)  oxyCODONE  (Oxy IR/ROXICODONE ) immediate release tablet 5 mg (5 mg Oral Given 11/27/24 1755)    Clinical Course as of 11/27/24 2122  Tue Nov 27, 2024  1724 Patient evaluated for bilateral buttock pain with radicular symptoms bilaterally.  No red flag symptoms.  Will provide a dose of Toradol  and prednisone  here in the ED. [JT]  1824 Difficulty ambulating despite Toradol  and prednisone .  Will provide a dose of oxycodone  and obtain CT lumbar [JT]  2023 CT Lumbar Spine Wo Contrast Severe multilevel degenerative changes, nondisplaced chronic fracture of the right L5 pars without any spondylolisthesis [JT]  2116 Upon reexamination patient reports marked improvement of her pain.  She still states that when she goes to ambulate she has worsening symptoms.  At this point I have offered additional pain medication, TOC/PT evaluation in the a.m. versus discharge with pursuing home PT.  Patient and her family at bedside would strongly prefer the latter.  I have explained to them at this time a night social work is unavailable and they will have to pursue home health PT through their PCP tomorrow morning.  They are understanding.  Regards to pain management.  Will send in a prednisone  taper.  They are additionally interested in some muscle relaxers.  Given her age have agreed to a few doses of Robaxin .  Sedating warnings provided.  They are additionally interested in Mobic .  She will forego any other NSAIDs.  I provided Ortho follow-up as well.  Strict return precautions provided.  Patient and family at bedside are all understanding and in agreement with plan [JT]    Clinical Course User Index [JT] Donnajean Lynwood DEL, PA-C                                 Medical Decision Making Amount and/or Complexity of Data Reviewed Radiology: ordered. Decision-making details documented in ED  Course.  Risk Prescription drug management.   This patient presents to the ED with chief complaint(s) of Back pain.  The complaint involves an extensive differential diagnosis and also carries with it a high risk of complications and morbidity.   Pertinent past medical history as listed in HPI  The differential diagnosis includes  Based off exam and history do not suspect cauda equina, fracture, dislocation, sprain, discitis, malignancy, SBO Additional history obtained: Additional history obtained from family Records reviewed Care Everywhere/External Records  Disposition:  Patient will be discharged home. The patient has been appropriately medically screened and/or stabilized in the ED. I have low suspicion for any other emergent medical condition which would require further screening, evaluation or treatment in the ED or require inpatient management. At time of discharge the patient is hemodynamically stable and in no acute distress. I have discussed work-up results and diagnosis with patient and answered all questions. Patient is agreeable with discharge plan. We discussed strict return precautions for returning to the emergency department and they verbalized understanding.     Social Determinants of Health:   none  This note was dictated with voice recognition software.  Despite best efforts at proofreading, errors may have occurred which can change the documentation meaning.       Final diagnoses:  Bilateral sciatica    ED Discharge Orders          Ordered    predniSONE  (DELTASONE ) 10 MG tablet  Daily        11/27/24 2119    methocarbamol  (ROBAXIN ) 500 MG tablet  2 times daily        11/27/24 2119    meloxicam  (MOBIC ) 7.5 MG tablet  Daily        11/27/24 2119               Donnajean Lynwood DEL, PA-C 11/27/24 2122    Elnor Jayson LABOR, DO 12/03/24 1650  "

## 2024-12-03 ENCOUNTER — Telehealth: Payer: Self-pay | Admitting: Family Medicine

## 2024-12-03 NOTE — Telephone Encounter (Signed)
 Has to be documented and ordered in a face 2 face encounter

## 2024-12-03 NOTE — Telephone Encounter (Signed)
 Will provider order referral? Copied from CRM #[id number]. Topic: Referral - Request for Referral >> Nov 30, 2024  1:36 PM Larissa S wrote: Did the patient discuss referral with their provider in the last year? No (If No - schedule appointment) (If Yes - send message)  Appointment offered? Yes  Type of order/referral and detailed reason for visit: In home physical therapy   Preference of office, provider, location:   If referral order, have you been seen by this specialty before? No (If Yes, this issue or another issue? When? Where?  Can we respond through MyChart? No

## 2024-12-03 NOTE — Telephone Encounter (Signed)
 I spoke to pt's daughter and advised her of provider feedback and she states they are currently in the ED waiting to be seen.

## 2024-12-13 ENCOUNTER — Inpatient Hospital Stay: Admitting: Nurse Practitioner

## 2024-12-13 NOTE — Progress Notes (Unsigned)
" ° °  Subjective:    Patient ID: Chelsea Finley, female    DOB: Sep 02, 1941, 84 y.o.   MRN: [id number]   Chief Complaint: hospital follow up  HPI Patient went to ED on 11/27/24 with c/o:complaints of bilateral buttock pain that radiates down her legs. Pain has been intermittent for over the past month. Denies any injury or trauma. States that she was initially seen beginning of November was treated with a short course of steroids. The provided significant relief. Was evaluated by her PCP yesterday and provided Voltaren  gel without any notable improvement. Denies any urinary or fecal incontinence. No fevers or history of malignancy. Does report that she has been constipated over the past couple days. Still passing flatus. No vomiting or abdominal pain. Does note prior abdominal surgical history. She was given toradol  and prednisone  as well as roxicodone .  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

## 2024-12-14 ENCOUNTER — Telehealth: Payer: Self-pay | Admitting: Family Medicine

## 2024-12-14 NOTE — Telephone Encounter (Unsigned)
 Copied from CRM #[id number]. Topic: Clinical - Medical Advice >> Dec 14, 2024 11:46 AM Miquel SAILOR wrote: Reason for CRM: pravastatin  (PRAVACHOL ) 80 MG tablet-Lidsey from [facility] [phone]: Is this for 1 pill or for half pill. Needs call back on clarification  Instructions: Take 1 tablet (80 mg total) by mouth daily.

## 2024-12-14 NOTE — Telephone Encounter (Signed)
 Left detailed message giving verbal approval for this.

## 2024-12-14 NOTE — Telephone Encounter (Signed)
 Left message for Chelsea Finley from [facility] to call back. Yes, per medication directions, patient is to take 1 tab daily of the Pravastatin  (Pravachol ) 80 mg tabs.

## 2024-12-14 NOTE — Telephone Encounter (Signed)
 Copied from CRM [id number]. Topic: Clinical - Home Health Verbal Orders >> Dec 14, 2024 11:44 AM Miquel SAILOR wrote: Caller/Agency: Jenniffer from [facility]  Callback Number: [phone] Service Requested: Physical Therapy Frequency: 1 week  5  2 week 1  Any new concerns about the patient? No >> Dec 14, 2024 11:50 AM Miquel SAILOR wrote: Jenniffer from [facility] [phone]

## 2024-12-20 NOTE — Telephone Encounter (Signed)
 Left message to call back.

## 2024-12-24 ENCOUNTER — Ambulatory Visit

## 2024-12-24 DIAGNOSIS — E669 Obesity, unspecified: Secondary | ICD-10-CM

## 2024-12-24 DIAGNOSIS — M5432 Sciatica, left side: Secondary | ICD-10-CM

## 2024-12-24 DIAGNOSIS — Z6833 Body mass index (BMI) 33.0-33.9, adult: Secondary | ICD-10-CM

## 2024-12-24 DIAGNOSIS — I10 Essential (primary) hypertension: Secondary | ICD-10-CM

## 2024-12-24 DIAGNOSIS — E039 Hypothyroidism, unspecified: Secondary | ICD-10-CM | POA: Diagnosis not present

## 2024-12-24 DIAGNOSIS — M8008XD Age-related osteoporosis with current pathological fracture, vertebra(e), subsequent encounter for fracture with routine healing: Secondary | ICD-10-CM

## 2024-12-24 DIAGNOSIS — M5431 Sciatica, right side: Secondary | ICD-10-CM

## 2024-12-24 DIAGNOSIS — E785 Hyperlipidemia, unspecified: Secondary | ICD-10-CM | POA: Diagnosis not present

## 2024-12-24 DIAGNOSIS — M48061 Spinal stenosis, lumbar region without neurogenic claudication: Secondary | ICD-10-CM | POA: Diagnosis not present

## 2024-12-24 DIAGNOSIS — Z9181 History of falling: Secondary | ICD-10-CM

## 2024-12-24 DIAGNOSIS — M47816 Spondylosis without myelopathy or radiculopathy, lumbar region: Secondary | ICD-10-CM | POA: Diagnosis not present

## 2024-12-25 ENCOUNTER — Ambulatory Visit: Admitting: Nurse Practitioner

## 2024-12-25 ENCOUNTER — Encounter: Payer: Self-pay | Admitting: Nurse Practitioner

## 2024-12-25 VITALS — BP 128/67 | HR 118 | Temp 97.8°F | Ht 62.0 in | Wt 173.0 lb

## 2024-12-25 DIAGNOSIS — Z09 Encounter for follow-up examination after completed treatment for conditions other than malignant neoplasm: Secondary | ICD-10-CM

## 2024-12-25 DIAGNOSIS — M8000XD Age-related osteoporosis with current pathological fracture, unspecified site, subsequent encounter for fracture with routine healing: Secondary | ICD-10-CM

## 2024-12-25 DIAGNOSIS — Z9889 Other specified postprocedural states: Secondary | ICD-10-CM

## 2024-12-25 DIAGNOSIS — Z87311 Personal history of (healed) other pathological fracture: Secondary | ICD-10-CM

## 2024-12-25 MED ORDER — OXYCODONE HCL 5 MG PO TABS: 5.0000 mg | ORAL_TABLET | Freq: Two times a day (BID) | ORAL | 0 refills | Status: AC

## 2024-12-25 MED ORDER — ALENDRONATE SODIUM 70 MG PO TABS: 70.0000 mg | ORAL_TABLET | ORAL | 13 refills | Status: AC

## 2024-12-25 NOTE — Patient Instructions (Signed)
 Vitamin D  Capsules or Tablets What is this medication? VITAMIN D  (VAHY tuh min D) prevents and treats low vitamin D  levels in your body. It works by increasing the amount of calcium  absorbed by your body. Vitamin D  and calcium  help build and maintain the health of your bones. Vitamin D  also plays an important role in supporting your immune system and brain health. This medicine may be used for other purposes; ask your health care provider or pharmacist if you have questions. COMMON BRAND NAME(S): D3 2000, D3 5000, DECARA, Deltalin , Dialyvite, Dialyvite Vitamin D , Dialyvite Vitamin D3, Drisdol , Ergo D, Happy Sunshine Vitamin D3, MAXIMUM D3, PureMark Naturals Vitamin D , Super Happy SUNSHINE Vitamin D3, Thera-D 2000, Thera-D 4000, Thera-D Rapid Repletion, THERA-D SPORT, True Vitamin D3, WEEKLY-D What should I tell my care team before I take this medication? They need to know if you have any of the following conditions: Cystic fibrosis Gallbladder disease High levels of calcium  in the blood High levels of vitamin D  in the blood Inflammatory bowel disease such, as Crohn's disease or ulcerative colitis Kidney disease Liver disease Parathyroid disease Other stomach disease An unusual or allergic reaction to vitamin D , other medications, foods, dyes, or preservatives Pregnant or trying to get pregnant Breast-feeding How should I use this medication? Take this medication by mouth with water. Take it as directed on the label at the same time every day. Take it with a meal or snack; for best results, take it with foods that contain fat (i.e., milk, yogurt, cheese). Do not use it more often than directed. Talk to your care team about the use of this medication in children. Special care may be needed. Overdosage: If you think you have taken too much of this medicine contact a poison control center or emergency room at once. NOTE: This medicine is only for you. Do not share this medicine with others. What  if I miss a dose? If you miss a dose, take it as soon as you can. If it is almost time for your next dose, take only that dose. Do not take double or extra doses. What may interact with this medication? Antacids Diuretics Magnesium supplements Medications for cholesterol, such as cholestyramine, colesevelam, or colestipol Medications for seizures, such as phenytoin, fosphenytoin Mineral oil Orlistat Phosphorus supplements Rifampin This list may not describe all possible interactions. Give your health care provider a list of all the medicines, herbs, non-prescription drugs, or dietary supplements you use. Also tell them if you smoke, drink alcohol , or use illegal drugs. Some items may interact with your medicine. What should I watch for while using this medication? Visit your care team for regular checks on your progress. You may need blood work done while you are taking this medication. Tell your care team if your symptoms do not start to get better or if they get worse. Do not take any non-prescription medications that have vitamin D , phosphorus, magnesium, or calcium  including antacids while taking this medication, unless your care team says you can. The extra supplements can cause side effects. What side effects may I notice from receiving this medication? Side effects that you should report to your care team as soon as possible: Allergic reactions--skin rash, itching, hives, swelling of the face, lips, tongue, or throat High calcium  level--increased thirst or amount of urine, nausea, vomiting, confusion, unusual weakness or fatigue, bone pain Side effects that usually do not require medical attention (report these to your care team if they continue or are bothersome): Constipation Loss  of appetite Nausea This list may not describe all possible side effects. Call your doctor for medical advice about side effects. You may report side effects to FDA at 1-800-FDA-1088. Where should I keep my  medication? Keep out of the reach of children and pets. Store at room temperature between 15 and 30 degrees C (59 and 86 degrees F). Protect from light. Get rid of any unused medication after the expiration date. To get rid of medications that are no longer needed or have expired: Take the medication to a medication take-back program. Check with your pharmacy or law enforcement to find a location. If you cannot return the medication, check the label or package insert to see if the medication should be thrown out in the garbage or flushed down the toilet. If you are not sure, ask your care team. If it is safe to put it in the trash, take the medication out of the container. Mix the medication with cat litter, dirt, coffee grounds, or other unwanted substance. Seal the mixture in a bag or container. Put it in the trash. NOTE: This sheet is a summary. It may not cover all possible information. If you have questions about this medicine, talk to your doctor, pharmacist, or health care provider.  2024 Elsevier/Gold Standard (2021-08-24 00:00:00)

## 2024-12-25 NOTE — Progress Notes (Signed)
 "  Subjective:    Patient ID: Chelsea Finley, female    DOB: 11-26-1941, 84 y.o.   MRN: [id number]   Chief Complaint: hospital follow up  HPI  Patient went to ED on 11/27/24 with bil sciatica pain. CT of lumbar spine showed multilevel degenerative changes. She was given prednisone , robaxin  and mobic  and discharged home. She actually ended up with tail bone fracture. She returned to hospital is on 12/03/25 for sacroplasty. Was discharged home with [facility] OT. All goals and outcomes were discussed with patient prior to discharge. Since discharge home. She is doing better. Stil a little weak but overall better.  8 is on hydrocodone  and is still needing at least 1x a day. Only takes 1/2 tablet at a time.   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  Eyes:  Negative for pain.  Respiratory:  Negative for shortness of breath.   Cardiovascular:  Negative for chest pain, palpitations and leg swelling.  Gastrointestinal:  Negative for abdominal pain.  Endocrine: Negative for polydipsia.  Skin:  Negative for rash.  Neurological:  Negative for dizziness, weakness and headaches.  Hematological:  Does not bruise/bleed easily.  All other systems reviewed and are negative.      Objective:   Physical Exam Constitutional:      Appearance: Normal appearance. She is obese.  Cardiovascular:     Rate and Rhythm: Normal rate and regular rhythm.     Heart sounds: Normal heart sounds.  Pulmonary:     Effort: Pulmonary effort is normal.     Breath sounds: Normal breath sounds.  Musculoskeletal:     Comments: Lowly from  sitting to standing Using walker to ambulate  Skin:    General: Skin is warm.  Neurological:     General: No focal deficit present.     Mental Status: She is alert and oriented to person, place, and time.  Psychiatric:        Mood and Affect: Mood normal.        Behavior: Behavior normal.    BP 128/67   Pulse (!) 118   Temp 97.8 F (36.6 C) (Temporal)   Ht 5' 2 (1.575 m)   Wt 173 lb (78.5 kg)   SpO2 96%   BMI 31.64 kg/m         Assessment & Plan:  Chelsea Finley in today with chief complaint of Hospitalization Follow-up   1. H/O sacroplasty (Primary) Do not take pain meds if not necessary - oxyCODONE  (OXY IR/ROXICODONE ) 5 MG immediate release tablet; Take 1 tablet (5 mg total) by mouth 2 (two) times daily.  Dispense: 30 tablet; Refill: 0  2. Age-related osteoporosis with current pathological fracture with routine healing, subsequent encounter Start back o fosamax  Daily vitamin d and calcium  Weight bearing exercises as can tolerate Fall prevention - alendronate  (FOSAMAX ) 70 MG tablet; Take 1 tablet (70 mg total) by mouth every 7 (seven) days. Take with a full glass of water on an empty stomach.  Dispense: 4 tablet; Refill: 13  3. Hospital discharge follow-up Hospital records reviewed    The above assessment and  management plan was discussed with the patient. The patient verbalized understanding of and has agreed to the management plan. Patient is aware to call the clinic if symptoms persist or worsen. Patient is aware when to return to the clinic for a follow-up visit. Patient educated on when it is appropriate to go to the emergency department.   Mary-Margaret Gladis, FNP   "

## 2025-04-02 ENCOUNTER — Ambulatory Visit: Admitting: Family Medicine
